# Patient Record
Sex: Female | Born: 1935 | Race: Black or African American | Hispanic: No | Smoking: Never smoker
Health system: Southern US, Community
[De-identification: ages and names within clinical notes are randomized; demographics above are authoritative.]

## PROBLEM LIST (undated history)

## (undated) DIAGNOSIS — F0390 Unspecified dementia without behavioral disturbance: Secondary | ICD-10-CM

## (undated) DIAGNOSIS — E785 Hyperlipidemia, unspecified: Secondary | ICD-10-CM

## (undated) DIAGNOSIS — I10 Essential (primary) hypertension: Secondary | ICD-10-CM

## (undated) DIAGNOSIS — L97529 Non-pressure chronic ulcer of other part of left foot with unspecified severity: Secondary | ICD-10-CM

## (undated) HISTORY — DX: Non-pressure chronic ulcer of other part of left foot with unspecified severity: L97.529

## (undated) HISTORY — DX: Hyperlipidemia, unspecified: E78.5

## (undated) HISTORY — DX: Essential (primary) hypertension: I10

---

## 2000-07-30 ENCOUNTER — Ambulatory Visit (HOSPITAL_COMMUNITY): Admission: AD | Admit: 2000-07-30 | Discharge: 2000-07-30 | Payer: Self-pay | Admitting: Cardiovascular Disease

## 2000-07-30 ENCOUNTER — Encounter: Payer: Self-pay | Admitting: Cardiovascular Disease

## 2006-09-04 ENCOUNTER — Ambulatory Visit (HOSPITAL_COMMUNITY): Admission: RE | Admit: 2006-09-04 | Discharge: 2006-09-04 | Payer: Self-pay | Admitting: Orthopedic Surgery

## 2006-09-08 ENCOUNTER — Inpatient Hospital Stay (HOSPITAL_COMMUNITY): Admission: EM | Admit: 2006-09-08 | Discharge: 2006-09-14 | Payer: Self-pay | Admitting: Emergency Medicine

## 2006-12-11 ENCOUNTER — Encounter: Admission: RE | Admit: 2006-12-11 | Discharge: 2006-12-11 | Payer: Self-pay | Admitting: Internal Medicine

## 2006-12-11 ENCOUNTER — Encounter (INDEPENDENT_AMBULATORY_CARE_PROVIDER_SITE_OTHER): Payer: Self-pay | Admitting: Diagnostic Radiology

## 2007-06-18 ENCOUNTER — Encounter: Admission: RE | Admit: 2007-06-18 | Discharge: 2007-06-18 | Payer: Self-pay | Admitting: Internal Medicine

## 2007-11-27 ENCOUNTER — Encounter: Admission: RE | Admit: 2007-11-27 | Discharge: 2007-11-27 | Payer: Self-pay | Admitting: Internal Medicine

## 2007-12-17 ENCOUNTER — Encounter: Admission: RE | Admit: 2007-12-17 | Discharge: 2007-12-17 | Payer: Self-pay | Admitting: Internal Medicine

## 2008-06-04 ENCOUNTER — Observation Stay (HOSPITAL_COMMUNITY): Admission: EM | Admit: 2008-06-04 | Discharge: 2008-06-05 | Payer: Self-pay | Admitting: Emergency Medicine

## 2008-06-17 ENCOUNTER — Encounter: Admission: RE | Admit: 2008-06-17 | Discharge: 2008-06-17 | Payer: Self-pay | Admitting: Internal Medicine

## 2008-09-19 ENCOUNTER — Inpatient Hospital Stay (HOSPITAL_COMMUNITY): Admission: EM | Admit: 2008-09-19 | Discharge: 2008-09-26 | Payer: Self-pay | Admitting: Emergency Medicine

## 2008-09-20 ENCOUNTER — Encounter (INDEPENDENT_AMBULATORY_CARE_PROVIDER_SITE_OTHER): Payer: Self-pay | Admitting: Internal Medicine

## 2008-09-20 ENCOUNTER — Ambulatory Visit: Payer: Self-pay | Admitting: Vascular Surgery

## 2008-12-19 ENCOUNTER — Encounter: Admission: RE | Admit: 2008-12-19 | Discharge: 2008-12-19 | Payer: Self-pay | Admitting: Internal Medicine

## 2009-06-14 ENCOUNTER — Encounter: Admission: RE | Admit: 2009-06-14 | Discharge: 2009-06-14 | Payer: Self-pay | Admitting: Internal Medicine

## 2009-10-14 ENCOUNTER — Emergency Department (HOSPITAL_COMMUNITY): Admission: EM | Admit: 2009-10-14 | Discharge: 2009-10-14 | Payer: Self-pay | Admitting: Emergency Medicine

## 2009-12-15 ENCOUNTER — Encounter: Admission: RE | Admit: 2009-12-15 | Discharge: 2009-12-15 | Payer: Self-pay | Admitting: Family Medicine

## 2009-12-15 ENCOUNTER — Ambulatory Visit: Payer: Self-pay | Admitting: Internal Medicine

## 2009-12-15 DIAGNOSIS — E785 Hyperlipidemia, unspecified: Secondary | ICD-10-CM | POA: Insufficient documentation

## 2009-12-15 DIAGNOSIS — R05 Cough: Secondary | ICD-10-CM

## 2009-12-15 DIAGNOSIS — I10 Essential (primary) hypertension: Secondary | ICD-10-CM | POA: Insufficient documentation

## 2009-12-15 DIAGNOSIS — Z8672 Personal history of thrombophlebitis: Secondary | ICD-10-CM | POA: Insufficient documentation

## 2009-12-15 DIAGNOSIS — R059 Cough, unspecified: Secondary | ICD-10-CM | POA: Insufficient documentation

## 2010-01-02 ENCOUNTER — Ambulatory Visit: Payer: Self-pay | Admitting: Internal Medicine

## 2010-07-10 NOTE — Assessment & Plan Note (Signed)
Summary: Pulmonary/ f/u ov cough resolved   Visit Type:  Follow-up Copy to:  Dr. Renaye Rakers Primary Provider/Referring Provider:  Dr. Renaye Rakers  CC:  2 week cough follow-up. The patient says she still has an occasional cough but it is much better overall. No complaints today.Marland Kitchen  History of Present Illness: 77 yobf never smoker lives with daughter quite frail but  no chronic respiratory problems.  December 15, 2009  1st pulmonary office eval for cough started spring 2010 gradually worse fluctuates some worse with no pattern x  while on prednisone did not need tessilon which helps some too, mostly dry imp was uacs, rec max gerd rx and change to bystolic in case asthma present  January 02, 2010 2 week cough follow-up. The patient says she still has an occasional cough but it is much better overall. No complaints today. no sob. Pt denies any significant sore throat, dysphagia, itching, sneezing,  nasal congestion or excess secretions,  fever, chills, sweats, unintended wt loss, pleuritic or exertional cp, hempoptysis, change in activity tolerance  orthopnea pnd or leg swelling   Current Medications (verified): 1)  Simvastatin 40 Mg Tabs (Simvastatin) .Marland Kitchen.. 1 At Bedtime 2)  Xalatan 0.005 % Soln (Latanoprost) .Marland Kitchen.. 1 Drop in Each Eye At Bedtime 3)  Omeprazole 20 Mg Cpdr (Omeprazole) .... Take One 30-60 Min Before First and Last Meals of The Day 4)  Diovan 160 Mg Tabs (Valsartan) .Marland Kitchen.. 1 Once Daily 5)  Bystolic 5 Mg  Tabs (Nebivolol Hcl) .... One Tablet Daily  Allergies (verified): 1)  ! Pcn  Past History:  Past Medical History: DEEP VENOUS THROMBOPHLEBITIS, HX OF (ICD-V12.52) HYPERTENSION (ICD-401.9) HYPERLIPIDEMIA (ICD-272.4) Cough..................................................onset spring 2010     - Try off non-specific BB December 15, 2009 > better   Vital Signs:  Patient profile:   78 year old female Height:      63 inches (160.02 cm) Weight:      144.50 pounds (65.68 kg) BMI:      25.69 O2 Sat:      95 % on Room air Temp:     98.6 degrees F (37.00 degrees C) oral Pulse rate:   67 / minute BP sitting:   118 / 74  (left arm) Cuff size:   regular  Vitals Entered By: Michel Bickers CMA (January 02, 2010 11:06 AM)  O2 Sat at Rest %:  95 O2 Flow:  Room air CC: 2 week cough follow-up. The patient says she still has an occasional cough but it is much better overall. No complaints today. Comments Medications reviewed with the patient. Daytime phone number verified. Michel Bickers [facility]  January 02, 2010 11:07 AM   Physical Exam  Additional Exam:  wt 145 December 16, 2009 > 144 January 02, 2010  elderly bf with mint in mouth very hesitant with responses, daughter freq corrects here HEENT: nl dentition, turbinates, and orophanx. Nl external ear canals without cough reflex NECK :  without JVD/Nodes/TM/ nl carotid upstrokes bilaterally LUNGS: no acc muscle use, clear to A and P bilaterally without cough on insp or exp maneuvers CV:  RRR  no s3 or murmur or increase in P2, no edema  ABD:  soft and nontender with nl excursion in the supine position. No bruits or organomegaly, bowel sounds nl MS:  warm without deformities, calf tenderness, cyanosis or clubbing      Impression & Recommendations:  Problem # 1:  COUGH (ICD-786.2)  ? GERD vs pseudoasthma related to beta blockers,  resolved on present rx so continue at least another 4 weeks  Pulmonary f/u can be as needed   Orders: Est. Patient Level III (62130)  Problem # 2:  HYPERTENSION (ICD-401.9)  Her updated medication list for this problem includes:    Diovan 160 Mg Tabs (Valsartan) .Marland Kitchen... 1 once daily    Bystolic 5 Mg Tabs (Nebivolol hcl) ..... One tablet daily  excellent control on present rx which includes Bystolic, the most beta -1  selective Beta blocker available in sample form, with bisoprolol the most selective generic choice  on the market.   Medications Added to Medication List This Visit: 1)  Xalatan 0.005 % Soln  (Latanoprost) .Marland Kitchen.. 1 drop in each eye at bedtime 2)  Omeprazole 20 Mg Cpdr (Omeprazole) .... Take  one 30-60 min before first meal of the day  Patient Instructions: 1)  reduce omeprazole to Take  one 30-60 min before first meal of the day  2)   Please schedule a follow-up appointment as needed and let Dr Parke Simmers re check you before you run out of bystolic Prescriptions: BYSTOLIC 5 MG  TABS (NEBIVOLOL HCL) One tablet daily  #30 x 11   Entered and Authorized by:   Nyoka Cowden MD   Signed by:   Nyoka Cowden MD on 01/02/2010   Method used:   Electronically to        [facility] (retail)       [address]       [place], [place]  [address]       Ph: [phone]       Fax: [phone]   RxID:   [id number]

## 2010-07-10 NOTE — Assessment & Plan Note (Signed)
Summary: Pulmonary/ new pt eval for cough on 2 beta blockers   Visit Type:  Initial Consult Copy to:  Dr. Renaye Rakers Primary Provider/Referring Provider:  Dr. Renaye Rakers  CC:  Cough.  History of Present Illness: 65 yobf never smoker lives with daughter quite frail but  no chronic respiratory problems.  December 15, 2009  1st pulmonary office eval for cough started spring 2010 gradually worse fluctuates some worse with no pattern x  while on prednisone did not need tessilon which helps some too, mostly dry. Pt denies any significant sore throat, dysphagia, itching, sneezing,  nasal congestion or excess secretions,  fever, chills, sweats, unintended wt loss, pleuritic or exertional cp, hempoptysis, change in activity tolerance  orthopnea pnd or leg swelling.  Pt also denies any obvious fluctuation in symptoms with weather or environmental change or other alleviating or aggravating factors.       Current Medications (verified): 1)  Simvastatin 40 Mg Tabs (Simvastatin) .Marland Kitchen.. 1 At Bedtime 2)  Timoptic 0.5 % Soln (Timolol Maleate) .Marland Kitchen.. 1 Drop Each Eye Every Am 3)  Omeprazole 20 Mg Cpdr (Omeprazole) .Marland Kitchen.. 1 Once Daily 4)  Diovan 160 Mg Tabs (Valsartan) .Marland Kitchen.. 1 Once Daily 5)  Carvedilol 6.25 Mg Tabs (Carvedilol) .Marland Kitchen.. 1 Two Times A Day  Allergies (verified): 1)  ! Pcn  Past History:  Past Medical History: DEEP VENOUS THROMBOPHLEBITIS, HX OF (ICD-V12.52) HYPERTENSION (ICD-401.9) HYPERLIPIDEMIA (ICD-272.4) Cough..................................................onset     - Try off non-specific BB December 15, 2009   Family History: Breast CA- Mother Negative for respiratory diseases or atopy   Social History: Widowed Children Never smoker No ETOH  Review of Systems       The patient complains of shortness of breath with activity, shortness of breath at rest, productive cough, non-productive cough, hand/feet swelling, and joint stiffness or pain.  The patient denies coughing up blood, chest  pain, irregular heartbeats, acid heartburn, indigestion, loss of appetite, weight change, abdominal pain, difficulty swallowing, sore throat, tooth/dental problems, headaches, nasal congestion/difficulty breathing through nose, sneezing, itching, ear ache, anxiety, depression, rash, change in color of mucus, and fever.    Vital Signs:  Patient profile:   77 year old female Height:      63 inches Weight:      145 pounds BMI:     25.78 O2 Sat:      98 % on Room air Temp:     98.9 degrees F oral Pulse rate:   64 / minute BP sitting:   150 / 82  (left arm)  Vitals Entered By: Vernie Murders (December 15, 2009 10:50 AM)  O2 Flow:  Room air  Physical Exam  Additional Exam:  wt 145 December 16, 2009 elderly bf with mint in mouth very hesitant with responses, daughter freq corrects here HEENT: nl dentition, turbinates, and orophanx. Nl external ear canals without cough reflex NECK :  without JVD/Nodes/TM/ nl carotid upstrokes bilaterally LUNGS: no acc muscle use, clear to A and P bilaterally without cough on insp or exp maneuvers CV:  RRR  no s3 or murmur or increase in P2, no edema  ABD:  soft and nontender with nl excursion in the supine position. No bruits or organomegaly, bowel sounds nl MS:  warm without deformities, calf tenderness, cyanosis or clubbing SKIN: warm and dry without lesions   NEURO:recall poor, no obvious focal deficits     CXR  Procedure date:  12/15/2009  Findings:      IMPRESSION: Interval  resolution of the lingular/left upper lobe pneumonia since the examination 2 months ago.  Stable cardiomegaly.  No acute cardiopulmonary disease currently.  Impression & Recommendations:  Problem # 1:  COUGH (ICD-786.2) Assessment Improved The most common causes of chronic cough in immunocompetent adults include: upper airway cough syndrome (UACS), previously referred to as postnasal drip syndrome,  caused by variety of rhinosinus conditions; (2) asthma; (3) GERD; (4) chronic  bronchitis from cigarette smoking or other inhaled environmental irritants; (5) nonasthmatic eosinophilic bronchitis; and (6) bronchiectasis. These conditions, singly or in combination, have accounted for up to 94% of the causes of chronic cough in prospective studies.   Previous response to prednisone suggests allergic rhinitis, eos bronchitis or cough variant asthma.    Try off nonselective beta blockers first step plus dietary modification in case reflux component.  The standardized cough guidelines  published in Chest are a 14 step process, not a single office visit,  and are intended  to address this problem logically,  with an alogrithm dependent on response to each progressive step  to determine a specific diagnosis with  minimal addtional testing needed. Therefore if compliance is an issue this empiric standardized approach simply won't work.   Problem # 2:  HYPERTENSION (ICD-401.9)  The following medications were removed from the medication list:    Carvedilol 6.25 Mg Tabs (Carvedilol) .Marland Kitchen... 1 two times a day Her updated medication list for this problem includes:    Diovan 160 Mg Tabs (Valsartan) .Marland Kitchen... 1 once daily    Bystolic 5 Mg Tabs (Nebivolol hcl) ..... One tablet daily -( Bystolic, the most beta -1  selective Beta blocker available in sample form, with bisoprolol the most selective generic choice  on the market)   Medications Added to Medication List This Visit: 1)  Simvastatin 40 Mg Tabs (Simvastatin) .Marland Kitchen.. 1 at bedtime 2)  Timoptic 0.5 % Soln (Timolol maleate) .Marland Kitchen.. 1 drop each eye every am 3)  Omeprazole 20 Mg Cpdr (Omeprazole) .Marland Kitchen.. 1 once daily 4)  Omeprazole 20 Mg Cpdr (Omeprazole) .... Take one 30-60 min before first and last meals of the day 5)  Diovan 160 Mg Tabs (Valsartan) .Marland Kitchen.. 1 once daily 6)  Carvedilol 6.25 Mg Tabs (Carvedilol) .Marland Kitchen.. 1 two times a day 7)  Bystolic 5 Mg Tabs (Nebivolol hcl) .... One tablet daily 8)  Prednisone 10 Mg Tabs (Prednisone) .... 4 each am x  2days, 2x2days, 1x2days and stop  Other Orders: T-2 View CXR ([id number])  Patient Instructions: 1)  Acid reflux is the leading suspect here and needs to be eliminated  completely before considering additional studies or treatment options. To suppress this maximally, take omeprazole (prilosec 20)  otc before first  and last meal and pepcid 20 mg (otc) at bedtime plus diet measures as listed.  2)  Ask eye doctor for betoptic instead for timoptic as this may be asthma 3)  Stop coreg and take bystolic 5 mg one daily 4)  Prednisone 4 each am x 2days, 2x2days, 1x2days and stop  5)  GERD (REFLUX)  is a common cause of respiratory symptoms. It commonly presents without heartburn and can be treated with medication, but also with lifestyle changes including avoidance of late meals, excessive alcohol, smoking cessation, and avoid fatty foods, chocolate, peppermint, colas, red wine, and acidic juices such as orange juice. NO MINT OR MENTHOL PRODUCTS SO NO COUGH DROPS  6)  USE SUGARLESS CANDY INSTEAD (jolley ranchers)  7)  NO OIL BASED VITAMINS  8)  Please schedule  a follow-up appointment in 2  weeks, sooner if needed  Prescriptions: PREDNISONE 10 MG  TABS (PREDNISONE) 4 each am x 2days, 2x2days, 1x2days and stop  #14 x 0   Entered and Authorized by:   Nyoka Cowden MD   Signed by:   Nyoka Cowden MD on 12/15/2009   Method used:   Electronically to        [facility] (retail)       [address]       [place], [place]  [address]       Ph: [phone]       Fax: [phone]   RxID:   [id number]   Appended Document: Orders Update    Clinical Lists Changes  Orders: Added new Service order of New Patient Level V [phone]) - Signed

## 2010-08-28 LAB — DIFFERENTIAL
Basophils Absolute: 0 10*3/uL (ref 0.0–0.1)
Basophils Relative: 0 % (ref 0–1)
Eosinophils Absolute: 0.1 10*3/uL (ref 0.0–0.7)
Eosinophils Relative: 1 % (ref 0–5)
Lymphocytes Relative: 11 % — ABNORMAL LOW (ref 12–46)
Lymphs Abs: 1.2 10*3/uL (ref 0.7–4.0)
Monocytes Absolute: 1.1 10*3/uL — ABNORMAL HIGH (ref 0.1–1.0)
Monocytes Relative: 10 % (ref 3–12)
Neutro Abs: 8.3 10*3/uL — ABNORMAL HIGH (ref 1.7–7.7)
Neutrophils Relative %: 78 % — ABNORMAL HIGH (ref 43–77)

## 2010-08-28 LAB — POCT I-STAT, CHEM 8
BUN: 21 mg/dL (ref 6–23)
Calcium, Ion: 1.09 mmol/L — ABNORMAL LOW (ref 1.12–1.32)
Chloride: 105 mEq/L (ref 96–112)
Creatinine, Ser: 1.4 mg/dL — ABNORMAL HIGH (ref 0.4–1.2)
Glucose, Bld: 125 mg/dL — ABNORMAL HIGH (ref 70–99)
HCT: 35 % — ABNORMAL LOW (ref 36.0–46.0)
Hemoglobin: 11.9 g/dL — ABNORMAL LOW (ref 12.0–15.0)
Potassium: 3.7 mEq/L (ref 3.5–5.1)
Sodium: 139 mEq/L (ref 135–145)
TCO2: 25 mmol/L (ref 0–100)

## 2010-08-28 LAB — POCT CARDIAC MARKERS
CKMB, poc: 4.6 ng/mL (ref 1.0–8.0)
Myoglobin, poc: 391 ng/mL (ref 12–200)
Troponin i, poc: <0.05 ng/mL (ref 0.00–0.09)

## 2010-08-28 LAB — CBC
HCT: 32 % — ABNORMAL LOW (ref 36.0–46.0)
Hemoglobin: 10.9 g/dL — ABNORMAL LOW (ref 12.0–15.0)
MCHC: 34.1 g/dL (ref 30.0–36.0)
MCV: 94.6 fL (ref 78.0–100.0)
Platelets: 217 10*3/uL (ref 150–400)
RBC: 3.38 MIL/uL — ABNORMAL LOW (ref 3.87–5.11)
RDW: 13.9 % (ref 11.5–15.5)
WBC: 10.7 10*3/uL — ABNORMAL HIGH (ref 4.0–10.5)

## 2010-08-28 LAB — BRAIN NATRIURETIC PEPTIDE: Pro B Natriuretic peptide (BNP): 130 pg/mL — ABNORMAL HIGH (ref 0.0–100.0)

## 2010-09-19 LAB — CBC
HCT: 28.3 % — ABNORMAL LOW (ref 36.0–46.0)
HCT: 29.9 % — ABNORMAL LOW (ref 36.0–46.0)
HCT: 30.4 % — ABNORMAL LOW (ref 36.0–46.0)
HCT: 31.4 % — ABNORMAL LOW (ref 36.0–46.0)
HCT: 14.1 % — ABNORMAL LOW (ref 36.0–46.0)
HCT: 21.7 % — ABNORMAL LOW (ref 36.0–46.0)
HCT: 31.4 % — ABNORMAL LOW (ref 36.0–46.0)
HCT: 32 % — ABNORMAL LOW (ref 36.0–46.0)
Hemoglobin: 10.1 g/dL — ABNORMAL LOW (ref 12.0–15.0)
Hemoglobin: 10.3 g/dL — ABNORMAL LOW (ref 12.0–15.0)
Hemoglobin: 10.6 g/dL — ABNORMAL LOW (ref 12.0–15.0)
Hemoglobin: 9.6 g/dL — ABNORMAL LOW (ref 12.0–15.0)
Hemoglobin: 10.7 g/dL — ABNORMAL LOW (ref 12.0–15.0)
Hemoglobin: 11 g/dL — ABNORMAL LOW (ref 12.0–15.0)
Hemoglobin: 4.6 g/dL — CL (ref 12.0–15.0)
Hemoglobin: 7.5 g/dL — CL (ref 12.0–15.0)
MCHC: 33.6 g/dL (ref 30.0–36.0)
MCHC: 33.8 g/dL (ref 30.0–36.0)
MCHC: 33.9 g/dL (ref 30.0–36.0)
MCHC: 34.1 g/dL (ref 30.0–36.0)
MCHC: 32.7 g/dL (ref 30.0–36.0)
MCHC: 33.9 g/dL (ref 30.0–36.0)
MCHC: 34.5 g/dL (ref 30.0–36.0)
MCHC: 34.6 g/dL (ref 30.0–36.0)
MCV: 89.9 fL (ref 78.0–100.0)
MCV: 90.3 fL (ref 78.0–100.0)
MCV: 90.3 fL (ref 78.0–100.0)
MCV: 90.5 fL (ref 78.0–100.0)
MCV: 89.4 fL (ref 78.0–100.0)
MCV: 90 fL (ref 78.0–100.0)
MCV: 91.8 fL (ref 78.0–100.0)
MCV: 97.6 fL (ref 78.0–100.0)
Platelets: 122 10*3/uL — ABNORMAL LOW (ref 150–400)
Platelets: 124 10*3/uL — ABNORMAL LOW (ref 150–400)
Platelets: 131 10*3/uL — ABNORMAL LOW (ref 150–400)
Platelets: 134 10*3/uL — ABNORMAL LOW (ref 150–400)
Platelets: 130 10*3/uL — ABNORMAL LOW (ref 150–400)
Platelets: 131 10*3/uL — ABNORMAL LOW (ref 150–400)
Platelets: 142 10*3/uL — ABNORMAL LOW (ref 150–400)
Platelets: 196 10*3/uL (ref 150–400)
RBC: 3.15 MIL/uL — ABNORMAL LOW (ref 3.87–5.11)
RBC: 3.3 MIL/uL — ABNORMAL LOW (ref 3.87–5.11)
RBC: 3.37 MIL/uL — ABNORMAL LOW (ref 3.87–5.11)
RBC: 3.48 MIL/uL — ABNORMAL LOW (ref 3.87–5.11)
RBC: 1.45 MIL/uL — ABNORMAL LOW (ref 3.87–5.11)
RBC: 2.37 MIL/uL — ABNORMAL LOW (ref 3.87–5.11)
RBC: 3.49 MIL/uL — ABNORMAL LOW (ref 3.87–5.11)
RBC: 3.58 MIL/uL — ABNORMAL LOW (ref 3.87–5.11)
RDW: 15.5 % (ref 11.5–15.5)
RDW: 15.6 % — ABNORMAL HIGH (ref 11.5–15.5)
RDW: 15.9 % — ABNORMAL HIGH (ref 11.5–15.5)
RDW: 16.4 % — ABNORMAL HIGH (ref 11.5–15.5)
RDW: 15.7 % — ABNORMAL HIGH (ref 11.5–15.5)
RDW: 16.5 % — ABNORMAL HIGH (ref 11.5–15.5)
RDW: 16.8 % — ABNORMAL HIGH (ref 11.5–15.5)
RDW: 17.3 % — ABNORMAL HIGH (ref 11.5–15.5)
WBC: 4.7 10*3/uL (ref 4.0–10.5)
WBC: 5 10*3/uL (ref 4.0–10.5)
WBC: 5.6 10*3/uL (ref 4.0–10.5)
WBC: 6.5 10*3/uL (ref 4.0–10.5)
WBC: 5.7 10*3/uL (ref 4.0–10.5)
WBC: 6 10*3/uL (ref 4.0–10.5)
WBC: 6.1 10*3/uL (ref 4.0–10.5)
WBC: 6.3 10*3/uL (ref 4.0–10.5)

## 2010-09-19 LAB — DIFFERENTIAL
Basophils Absolute: 0 10*3/uL (ref 0.0–0.1)
Basophils Relative: 0 % (ref 0–1)
Eosinophils Absolute: 0.1 10*3/uL (ref 0.0–0.7)
Eosinophils Relative: 2 % (ref 0–5)
Lymphocytes Relative: 21 % (ref 12–46)
Lymphs Abs: 1.3 10*3/uL (ref 0.7–4.0)
Monocytes Absolute: 0.7 10*3/uL (ref 0.1–1.0)
Monocytes Relative: 12 % (ref 3–12)
Neutro Abs: 4 10*3/uL (ref 1.7–7.7)
Neutrophils Relative %: 65 % (ref 43–77)

## 2010-09-19 LAB — POCT CARDIAC MARKERS
CKMB, poc: 1 ng/mL (ref 1.0–8.0)
Myoglobin, poc: 212 ng/mL (ref 12–200)
Troponin i, poc: <0.05 ng/mL (ref 0.00–0.09)

## 2010-09-19 LAB — BASIC METABOLIC PANEL
BUN: 5 mg/dL — ABNORMAL LOW (ref 6–23)
BUN: 6 mg/dL (ref 6–23)
BUN: 8 mg/dL (ref 6–23)
BUN: 11 mg/dL (ref 6–23)
BUN: 16 mg/dL (ref 6–23)
BUN: 35 mg/dL — ABNORMAL HIGH (ref 6–23)
CO2: 24 mEq/L (ref 19–32)
CO2: 24 mEq/L (ref 19–32)
CO2: 25 mEq/L (ref 19–32)
CO2: 22 mEq/L (ref 19–32)
CO2: 24 mEq/L (ref 19–32)
CO2: 25 mEq/L (ref 19–32)
Calcium: 8.1 mg/dL — ABNORMAL LOW (ref 8.4–10.5)
Calcium: 8.2 mg/dL — ABNORMAL LOW (ref 8.4–10.5)
Calcium: 8.5 mg/dL (ref 8.4–10.5)
Calcium: 8 mg/dL — ABNORMAL LOW (ref 8.4–10.5)
Calcium: 8.1 mg/dL — ABNORMAL LOW (ref 8.4–10.5)
Calcium: 8.2 mg/dL — ABNORMAL LOW (ref 8.4–10.5)
Chloride: 109 mEq/L (ref 96–112)
Chloride: 113 mEq/L — ABNORMAL HIGH (ref 96–112)
Chloride: 114 mEq/L — ABNORMAL HIGH (ref 96–112)
Chloride: 111 mEq/L (ref 96–112)
Chloride: 111 mEq/L (ref 96–112)
Chloride: 113 mEq/L — ABNORMAL HIGH (ref 96–112)
Creatinine, Ser: 1.01 mg/dL (ref 0.4–1.2)
Creatinine, Ser: 1.02 mg/dL (ref 0.4–1.2)
Creatinine, Ser: 1.14 mg/dL (ref 0.4–1.2)
Creatinine, Ser: 1.12 mg/dL (ref 0.4–1.2)
Creatinine, Ser: 1.25 mg/dL — ABNORMAL HIGH (ref 0.4–1.2)
Creatinine, Ser: 1.41 mg/dL — ABNORMAL HIGH (ref 0.4–1.2)
GFR calc Af Amer: 54 mL/min — ABNORMAL LOW (ref >60)
GFR calc Af Amer: >60 mL/min (ref >60)
GFR calc Af Amer: >60 mL/min (ref >60)
GFR calc Af Amer: 42 mL/min — ABNORMAL LOW (ref >60)
GFR calc Af Amer: 49 mL/min — ABNORMAL LOW (ref >60)
GFR calc Af Amer: 55 mL/min — ABNORMAL LOW (ref >60)
GFR calc non Af Amer: 45 mL/min — ABNORMAL LOW (ref >60)
GFR calc non Af Amer: 51 mL/min — ABNORMAL LOW (ref >60)
GFR calc non Af Amer: 51 mL/min — ABNORMAL LOW (ref >60)
GFR calc non Af Amer: 35 mL/min — ABNORMAL LOW (ref >60)
GFR calc non Af Amer: 40 mL/min — ABNORMAL LOW (ref >60)
GFR calc non Af Amer: 46 mL/min — ABNORMAL LOW (ref >60)
Glucose, Bld: 92 mg/dL (ref 70–99)
Glucose, Bld: 93 mg/dL (ref 70–99)
Glucose, Bld: 98 mg/dL (ref 70–99)
Glucose, Bld: 103 mg/dL — ABNORMAL HIGH (ref 70–99)
Glucose, Bld: 104 mg/dL — ABNORMAL HIGH (ref 70–99)
Glucose, Bld: 98 mg/dL (ref 70–99)
Potassium: 3.2 mEq/L — ABNORMAL LOW (ref 3.5–5.1)
Potassium: 3.4 mEq/L — ABNORMAL LOW (ref 3.5–5.1)
Potassium: 3.8 mEq/L (ref 3.5–5.1)
Potassium: 3.3 mEq/L — ABNORMAL LOW (ref 3.5–5.1)
Potassium: 3.4 mEq/L — ABNORMAL LOW (ref 3.5–5.1)
Potassium: 3.7 mEq/L (ref 3.5–5.1)
Sodium: 140 mEq/L (ref 135–145)
Sodium: 142 mEq/L (ref 135–145)
Sodium: 142 mEq/L (ref 135–145)
Sodium: 143 mEq/L (ref 135–145)
Sodium: 143 mEq/L (ref 135–145)
Sodium: 143 mEq/L (ref 135–145)

## 2010-09-19 LAB — CROSSMATCH
ABO/RH(D): A POS
Antibody Screen: NEGATIVE

## 2010-09-19 LAB — PROTIME-INR
INR: 1.3 (ref 0.00–1.49)
INR: 1.2 (ref 0.00–1.49)
INR: 1.3 (ref 0.00–1.49)
INR: 1.3 (ref 0.00–1.49)
INR: 3.6 — ABNORMAL HIGH (ref 0.00–1.49)
Prothrombin Time: 16.3 seconds — ABNORMAL HIGH (ref 11.6–15.2)
Prothrombin Time: 15.8 seconds — ABNORMAL HIGH (ref 11.6–15.2)
Prothrombin Time: 16.2 seconds — ABNORMAL HIGH (ref 11.6–15.2)
Prothrombin Time: 16.7 seconds — ABNORMAL HIGH (ref 11.6–15.2)
Prothrombin Time: 39.2 seconds — ABNORMAL HIGH (ref 11.6–15.2)

## 2010-09-19 LAB — RETICULOCYTES
RBC.: 2.43 MIL/uL — ABNORMAL LOW (ref 3.87–5.11)
Retic Count, Absolute: 109.4 10*3/uL (ref 19.0–186.0)
Retic Ct Pct: 4.5 % — ABNORMAL HIGH (ref 0.4–3.1)

## 2010-09-19 LAB — VITAMIN B12: Vitamin B-12: 449 pg/mL (ref 211–911)

## 2010-09-19 LAB — URINE MICROSCOPIC-ADD ON

## 2010-09-19 LAB — POCT I-STAT, CHEM 8
BUN: 72 mg/dL — ABNORMAL HIGH (ref 6–23)
Calcium, Ion: 1.17 mmol/L (ref 1.12–1.32)
Chloride: 117 mEq/L — ABNORMAL HIGH (ref 96–112)
Creatinine, Ser: 1.6 mg/dL — ABNORMAL HIGH (ref 0.4–1.2)
Glucose, Bld: 116 mg/dL — ABNORMAL HIGH (ref 70–99)
HCT: 15 % — ABNORMAL LOW (ref 36.0–46.0)
Hemoglobin: 5.1 g/dL — CL (ref 12.0–15.0)
Potassium: 3.7 mEq/L (ref 3.5–5.1)
Sodium: 147 mEq/L — ABNORMAL HIGH (ref 135–145)
TCO2: 20 mmol/L (ref 0–100)

## 2010-09-19 LAB — URINALYSIS, ROUTINE W REFLEX MICROSCOPIC
Bilirubin Urine: NEGATIVE
Glucose, UA: NEGATIVE mg/dL
Ketones, ur: NEGATIVE mg/dL
Leukocytes, UA: NEGATIVE
Nitrite: NEGATIVE
Protein, ur: NEGATIVE mg/dL
Specific Gravity, Urine: 1.017 (ref 1.005–1.030)
Urobilinogen, UA: 0.2 mg/dL (ref 0.0–1.0)
pH: 5.5 (ref 5.0–8.0)

## 2010-09-19 LAB — APTT
aPTT: 31 seconds (ref 24–37)
aPTT: 29 seconds (ref 24–37)

## 2010-09-19 LAB — URINE CULTURE
Colony Count: NO GROWTH
Culture: NO GROWTH

## 2010-09-19 LAB — CARDIAC PANEL(CRET KIN+CKTOT+MB+TROPI)
CK, MB: 2.4 ng/mL (ref 0.3–4.0)
Relative Index: 1.4 (ref 0.0–2.5)
Total CK: 172 U/L (ref 7–177)
Troponin I: 0.04 ng/mL (ref 0.00–0.06)

## 2010-09-19 LAB — PREPARE RBC (CROSSMATCH)

## 2010-09-19 LAB — HEPATIC FUNCTION PANEL
ALT: 21 U/L (ref 0–35)
AST: 43 U/L — ABNORMAL HIGH (ref 0–37)
Albumin: 2.9 g/dL — ABNORMAL LOW (ref 3.5–5.2)
Alkaline Phosphatase: 52 U/L (ref 39–117)
Bilirubin, Direct: 0.3 mg/dL (ref 0.0–0.3)
Indirect Bilirubin: 1.5 mg/dL — ABNORMAL HIGH (ref 0.3–0.9)
Total Bilirubin: 1.8 mg/dL — ABNORMAL HIGH (ref 0.3–1.2)
Total Protein: 6.4 g/dL (ref 6.0–8.3)

## 2010-09-19 LAB — IRON AND TIBC
Iron: 18 ug/dL — ABNORMAL LOW (ref 42–135)
Saturation Ratios: 6 % — ABNORMAL LOW (ref 20–55)
TIBC: 298 ug/dL (ref 250–470)
UIBC: 280 ug/dL

## 2010-09-19 LAB — PREPARE FRESH FROZEN PLASMA

## 2010-09-19 LAB — FOLATE: Folate: >20 ng/mL

## 2010-09-19 LAB — GLUCOSE, CAPILLARY: Glucose-Capillary: 108 mg/dL — ABNORMAL HIGH (ref 70–99)

## 2010-09-19 LAB — HEMOCCULT GUIAC POC 1CARD (OFFICE): Fecal Occult Bld: POSITIVE

## 2010-09-19 LAB — TSH: TSH: 2.146 u[IU]/mL (ref 0.350–4.500)

## 2010-09-19 LAB — ABO/RH: ABO/RH(D): A POS

## 2010-09-19 LAB — FERRITIN: Ferritin: 18 ng/mL (ref 10–291)

## 2010-09-24 ENCOUNTER — Ambulatory Visit
Admission: RE | Admit: 2010-09-24 | Discharge: 2010-09-24 | Disposition: A | Discharge status: Home / Self Care | Payer: Medicare HMO | Source: Ambulatory Visit | Attending: Orthopedic Surgery | Admitting: Orthopedic Surgery

## 2010-09-24 ENCOUNTER — Other Ambulatory Visit: Payer: Self-pay | Admitting: Orthopedic Surgery

## 2010-09-24 DIAGNOSIS — M5137 Other intervertebral disc degeneration, lumbosacral region: Secondary | ICD-10-CM

## 2010-09-24 DIAGNOSIS — R52 Pain, unspecified: Secondary | ICD-10-CM

## 2010-10-22 ENCOUNTER — Other Ambulatory Visit: Payer: Self-pay | Admitting: Family Medicine

## 2010-10-22 DIAGNOSIS — M7989 Other specified soft tissue disorders: Secondary | ICD-10-CM

## 2010-10-23 NOTE — H&P (Signed)
[name], [name] NO.:  [id number]   MEDICAL RECORD NO.:  [id number]          PATIENT TYPE:  EMS   LOCATION:  [facility]                         FACILITY:  [facility]   PHYSICIAN:  [name], M.D.      DATE OF BIRTH:  [birth year]   DATE OF ADMISSION:  06/04/2008  DATE OF DISCHARGE:                              HISTORY & PHYSICAL   PRIMARY CARE PHYSICIAN:  [name].   PRESENTING COMPLAINT:  Shortness of breath.   HISTORY OF PRESENT ILLNESS:  The patient is a 77 year old female with  history of pulmonary embolism back in April 2008, who is still on  Coumadin that started having problem with chest pain and shortness of  breath today.  The shortness of breath is mainly at rest and also with  exertion.  She denied any cough or hemoptysis.  She has some chest  discomfort but not clear-cut pain.  Denied any hemoptysis.  Denied any  PND or orthopnea.  The onset was today and persistent.  The patient  therefore came to the emergency room for further management.  She is  feeling much better now in the ER.  Denied any diaphoresis.  No  radiation of chest pain.  No aggravating or relieving factors.   PAST MEDICAL HISTORY:  1. Hypertension.  2. Osteoarthritis.  3. History of coronary artery disease with Dr. Nanetta Batty her      cardiologist.  4. The patient also had a prior history of PE in March 2008.  5. Glaucoma.  6. Some TIAs.  7. Chronic dizziness on meclizine.  8. Dyslipidemia.   ALLERGIES:  SHE IS ALLERGIC TO PENICILLIN.   MEDICATIONS:  1. Cosopt eye drops one each eye daily twice a day.  2. Alphagan 1 in each eye three times a day.  3. Omeprazole 20 mg twice a day.  4. Darvocet-N 100 q.6 h., p.r.n.  5. Meclizine 25 mg q.6 h., p.r.n.  6. Nitroglycerin 0.4 mg q.5 minutes p.r.n.  7. Simvastatin 40 mg daily.  8. Diovan/hydrochlorothiazide HCT 160/12.5 daily.  9. Simvastatin 20 mg daily.  10.Aspirin 81 mg daily.   SOCIAL HISTORY:  The patient lives  in [place] with family.  No  tobacco, alcohol or IV drug use.   FAMILY HISTORY:  Noncontributory due to the patient's age.   REVIEW OF SYSTEMS:  A 14 point review of systems is negative except per  HPI.   PHYSICAL EXAMINATION:  VITAL SIGNS:  Temperature 98.  Initial Bruce  pressure 185/68, currently 128/98 with a pulse of 64, respiratory rate  16, sats 95% on room air.  GENERAL:  The patient is awake, alert and oriented.  A very pleasant  woman in no acute distress.  HEENT:  PERRL.  EOMI.  NECK:  Supple.  No JVD, no lymphadenopathy.  RESPIRATORY:  She has good air entry bilaterally.  No wheezes or rales.  CARDIOVASCULAR:  She has S1-S2.  No murmur.  ABDOMEN:  Soft, nontender with positive bowel.  EXTREMITIES:  No edema, cyanosis or clubbing.   LABORATORY DATA:  White count is 4.5, hemoglobin 10.6  with an MCV of  95.8.  Platelet count 164, PT 26.5, INR 2.3.  PTT 45.  BNP is 30.  CK is  117 with an MB of 1.5.  Magnesium 2.2.  Troponin is 0.01.  Urinalysis is  negative.  Sodium 138, potassium 4.0, chloride 108, CO2 of 26, glucose  107, BUN 25, creatinine 1.28, calcium 8.6.  Her chest x-ray shows stable  cardiomegaly with suboptimal inspiration.  Possibly bibasilar  atelectasis, but no acute cardiopulmonary changes.  EKG showed no  significant change from previous EKG.   ASSESSMENT:  This a 77 year old female with history of coronary artery  disease, as well as pulmonary edema who is therapeutic on Coumadin  presenting with shortness of breath.  Differentials are quite varied.  This ranged from his acute coronary syndrome or angina equivalent and  also pulmonary embolism even though her INR is very much therapeutic.   PLAN:  1. Chest pain.  Will admit the patient for cardiac rule out with      serial cardiac enzymes.  Keep her on oxygen.  Continue with her      home therapy.  Will also get a chest CT with contrast with PE      protocol to rule out PE.  Will get a 2-D echo as  well.  Even though      her BNP is 30, showing that this is probably less likely to be      cardiac.  Hopefully, the patient's results will be back early      enough so will start in-house observation.  2. Coronary artery disease.  Again will treat her as indicated above      with ruling out MI.  3. Dyslipidemia.  I will check fasting lipid panel and continue with      her simvastatin.  4. Hypertension.  I will continue with her Diovan/hydrochlorothiazide      at this point.  5. Anemia.  The patient has what appears to be chronic anemia,      normocytic anemia.  Will check an anemia panel, including B12 and      folate.  Further treatment will depend on how patient responds to      these initial measures.      [name], M.D.  Electronically Signed     LG/MEDQ  D:  06/04/2008  T:  06/04/2008  Job:  [id number]

## 2010-10-23 NOTE — H&P (Signed)
[name], STAVE NO.:  [id number]   MEDICAL RECORD NO.:  [id number]          PATIENT TYPE:  INP   LOCATION:  1823                         FACILITY:  [facility]   PHYSICIAN:  Eduard Clos, MDDATE OF BIRTH:  [birth year]   DATE OF ADMISSION:  09/19/2008  DATE OF DISCHARGE:                              HISTORY & PHYSICAL   The patient is a primary care patient of Dr. Dorothyann Peng.   CHIEF COMPLAINT/BRIEF ADMITTING HISTORY OF PRESENTING ILLNESS:  A 77-  year-old female with a history of pulmonary embolism diagnosed in April  2008  on Coumadin, history of CAD, hypertension, hyperlipidemia,  glaucoma, history of TIA was brought into the ER.  The patient's family  found that the patient was having increased weakness over the last 1  week.  Patient also usually walks from the bed to the bathroom with help  of a walker.  Was unable to do her usual activities.  She also had some  difficulty talking, but she was feeling very weak to talk, but had no  difficulty swallowing.  Did not have any focal weakness or had any  headache or loss of consciousness.  Denied any chest pain, shortness of  breath.  Denies any fever, chills, nausea, vomiting, diarrhea, dysuria,  discharge.  The patient was found to have very low hemoglobin at 5.1 and  did not notice any black stools or nausea or vomiting or anything bloody  in the stool.   In the ER the patient was found to be having hemoglobin of 5.1 at the  time of admission.  For further management of her serial anemia on  admission, patient also has coagulopathy from her Coumadin.  INR of 3.6  blood transfusion now.  vitamin K to reverse her coagulopathy I did  discuss with patient's daughter  reversing the coagulation, but at this  time  also mildly hypertensive .   PAST MEDICAL HISTORY:  1. Hypertension.  2. Hyperlipidemia.  3. Arthritis.  4. Glaucoma.   PAST SURGICAL HISTORY:  None per family.   MEDICATIONS AT  ADMISSION:  1. Cosopt eye drops.  2. Alphagan 1 drop.  3. meprazole 20 mg p.o. daily.  4. Darvocet-N 100 p.o.  for Pain.  5. Simvastatin 30 mg p.o. daily.  6. Diovan HCT 160/12.5 mg p.o. daily.  7. Imdur 60 mg p.o. daily.   ALLERGIES:  PENICILLIN  .   Denies smoking cigarettes .   PHYSICAL EXAMINATION:  The patient examined at bedside.  Not in acute  distress.  VITAL SIGNS:  Blood pressure 104/90, pulse 67,  temperature 98.1,  respirations 18.  HEENT:  Anicteric with pallor, tongue is midline, no facial asymmetry .  HEART:  S1, S2 heard.  ABDOMEN:  Soft, nontender, bowel sounds heard.  CNS:  Alert, awake, and oriented to time, place, and person.  Moves  upper and lower extremities.  EXTREMITIES:  Peripheral pulses felt, no edema.   LABORATORIES:  CBC:  WBC 6.1, hemoglobin 5.1, hematocrit 13, platelets  196,  .  PT/INR 39.2 and 3.6.  Metabolic panel:  Sodium 145, potassium  3.5, chloride 117, glucose 115, BUN 72, creatinine 0.6.  Troponin  negative.  UA:  Trace blood, wbc's 0,  .   Assessment   Generalized weakness and fatigue from dehydration/Rule out CVA  History of hypertension.  Coronary artery disease  Hyperlipidemia.  Hypernatremia from dehydration.  History of transient ischemic attack.   PLAN:  Will admit patient to step-down unit for close observation.  At  this time a total of 3 units of  PRBCs have been ordered.  Will  discontinue Coumadin.  Will recheck Dopplers of lower extremities to  make sure there are not any active DVT's.  Get the CT of the head as  patient had some difficulty expressing  , but presently  has no focal  deficit.  hOLD all of antihypertensives .  Will give fluid resuscitation  .  GI consult  will follow their recommendations and further  recommendations as condition evolves .      Eduard Clos, MD  Electronically Signed     ANK/MEDQ  D:  09/19/2008  T:  09/19/2008  Job:  [id number]   cc:   Candyce Churn. Allyne Gee, M.D.

## 2010-10-23 NOTE — Discharge Summary (Signed)
NAMEBRANTLEIGH, MIFFLIN               ACCOUNT NO.:  [id number]   MEDICAL RECORD NO.:  [id number]          PATIENT TYPE:  INP   LOCATION:  5532                         FACILITY:  [facility]   PHYSICIAN:  Beckey Rutter, MD  DATE OF BIRTH:  09-28-1915   DATE OF ADMISSION:  09/19/2008  DATE OF DISCHARGE:  09/26/2008                               DISCHARGE SUMMARY   CHIEF COMPLAINT:  Generalized weakness.   HOSPITAL COURSE:  Ms. Rybacki was found to have severe anemia on the day  of presentation.  The iron profile was consistent with degree of iron  deficiency.  The patient had GI evaluation after her hemoglobin was  stabilized by several blood transfusions.  The patient underwent EGD  with the result negative for source of bleeding, although there was some  evidence of mild gastritis and hiatal hernia.   The colonoscopy procedure for GI blood loss was not conducted and I had  several discussions with Dr. Loreta Ave and her daughter.  The plan now is to  discharge the patient to follow up with the GI as an outpatient since  the patient has high risk for procedure at this time as per Dr. Loreta Ave.  The daughter is agreeable and aware of the plan, and in fact, she wanted  to seek a second opinion in this regards.   Her H&H remained stable, currently is 10.6.  The patient will be  discharged on iron supplementation.   HOSPITAL PROCEDURES:  CT head without contrast on the day of admission  showing age-related atrophy without evidence of focal or acute findings.   Portable chest x-ray on September 19, 2008.  Impression is,  1. Right upper extremity PICC line, tip in the lower SVC just above      the expected right atrial junction.  No pneumothorax.  2. Stable moderate cardiomegaly.   EGD done by Dr. Jeani Hawking on April 16.  Impression is,  1. Hiatal hernia.  2. Mild pyloric gastritis.   DISCHARGE DIAGNOSES:  1. Severe anemia felt secondary to chronic blood loss anemia and iron      deficiency.  2.  Chronic blood loss anemia.  3. Iron deficiency.  4. Hyperlipidemia.  5. Questionable coronary artery disease.   DISCHARGE MEDICATIONS:  1. Nu-Iron 150 mg p.o. b.i.d.  2. Cosopt eye drops.  3. Alphagan 1 drop.  4. Darvocet-N 100 p.r.n.  5. Zocor 30 mg p.o. daily.  6. Diovan HCT 160/12.5 mg p.o. daily.  7. Imdur 60 mg p.o. daily.  8. Coreg 6.25 mg p.o. daily.   DISCHARGE PLAN:  The patient is discharged to follow up with Dr. Loreta Ave  and Gastroenterology as discussed with the patient's daughter.  I would  also recommend followup with his primary physician, Dr. Allyne Gee.  As per  the daughter's discussion, was taking Coumadin for pulmonary embolism  diagnosed in 2008.  Secondary to the severe anemia with element of blood  loss at this time, the Coumadin is contraindicated.  Furthermore, since  the patient has PE in 2008 without any further events, aspirin will be  enough as a secondary  prevention at this time.  I advised the daughter  to follow up with Dr. Allyne Gee for further management as needed in  regards of the Coumadin and the colonoscopy procedure.      Beckey Rutter, MD  Electronically Signed     EME/MEDQ  D:  09/25/2008  T:  09/26/2008  Job:  [id number]   cc:   Donnel Saxon, MD

## 2010-10-23 NOTE — Discharge Summary (Signed)
[name], [name]               ACCOUNT NO.:  [id number]   MEDICAL RECORD NO.:  [id number]          PATIENT TYPE:  OBV   LOCATION:  3711                         FACILITY:  [facility]   PHYSICIAN:  Michelene Gardener, MD    DATE OF BIRTH:  [birth year]   DATE OF ADMISSION:  06/03/2008  DATE OF DISCHARGE:  06/05/2008                               DISCHARGE SUMMARY   DISCHARGE DIAGNOSES:  1. Shortness of breath.  2. Questionable history of coronary artery disease.  3. Hypertension.  4. Hyperlipidemia.  5. Anemia.  6. Osteoarthritis.  7. History of pulmonary embolism in March 2008.  8. Glaucoma.  9. Chronic dizziness and the patient is on meclizine.  10.Dyslipidemia.   DISCHARGE MEDICATIONS:  New medications:  1. Ferrous sulfate 325 mg p.o. twice daily.  2. Albuterol inhaler 2 puffs q.4 h. as needed.  3. Avelox 400 mg p.o. once a day x7 days.  Old medications:  1. Cosopt Eye Drops 1 drop to each eye twice daily.  2. Alphagan 1 drop in each eye 3 times daily.  3. Omeprazole 20 mg once a day.  4. Darvocet-N 100 q.6 h. as needed.  5. Meclizine 25 mg q.6 h. as needed.  6. Nitroglycerin 0.4 mg q.5 minutes as needed.  7. Simvastatin 40 mg p.o. once daily.  8. Diovan/hydrochlorothiazide 160/12.5 mg once a day.  9. Aspirin 81 mg once a day.   CONSULTATIONS:  None.   PROCEDURES:  None.   RADIOLOGY STUDIES:  Chest x-ray on May 11, 2008, showed stable  cardiomegaly without evidence of acute problem.   FOLLOWUP:  With primary doctor within 2-3 days.   COURSE OF HOSPITALIZATION:  1. Shortness of breath.  This patient presented with increasing      shortness of breath to the hospital.  The patient was treated with      nebulizer treatment in the ER and she improved.  From the history      and physical, the patient was having some chest tightness.  During      my evaluation and when I spoke to her, she mentioned that she never      had chest pain; to me, she is a reliable historian.   The patient      was monitored in the hospital.  Telemetry monitor did not show any      acute findings.  Three sets of troponin and cardiac enzymes were      done and they came to be negative.  Her shortness of breath might      be related to bronchitis.  We will discharge her on Avelox 400 mg      once a day x7 days and I would give her albuterol inhaler as      needed.  I discussed with her and with her daughter possible      evaluation with Cardiology, but they both regret and they would      like to do this as an outpatient.  I asked them to stay to do an      echocardiogram that again family  preferred to do this as an      outpatient.  At the time of discharge, the patient is totally back      to her baseline.  She does not have any shortness of breath.  She      does not have any pain.  Oxygen was taken off and her saturation      remained in the mid to high 90s in room air.  I advised them to see      the primary physician within 2-3 days for possible evaluation for      echocardiogram versus stress test.  I also advised them to come to      the ER if she developed more chest pain or increasing shortness of      breath.  2. A questionable history of coronary artery disease.  The patient's      family denied any history of coronary artery disease and they said      the only problem is her pulmonary embolism.  I discontinued her      current medications and then asked them to follow with the primary      doctor as mentioned above.  3. History of pulmonary embolism.  The patient is on Coumadin.  Her      INR was therapeutic.  The patient was continued on the same dose of      Coumadin and again she was informed to follow with her primary      doctor as usual to check her INR and adjust her Coumadin as needed.  4. Anemia.  Her anemia profile was done and her iron was low.  The      patient was started on iron sulfate.   Otherwise, other medical conditions remained stable.    ASSESSMENT TIME:  40 minutes.      Michelene Gardener, MD  Electronically Signed     NAE/MEDQ  D:  06/05/2008  T:  06/05/2008  Job:  [id number]

## 2010-10-24 ENCOUNTER — Other Ambulatory Visit: Payer: Medicare HMO

## 2010-10-26 NOTE — Discharge Summary (Signed)
[name], [name]               ACCOUNT NO.:  [id number]   MEDICAL RECORD NO.:  [id number]          PATIENT TYPE:  INP   LOCATION:  3735                         FACILITY:  [facility]   PHYSICIAN:  Herbie Saxon, MDDATE OF BIRTH:  1916/02/06   DATE OF PROCEDURE:  DATE OF DISCHARGE:  09/14/2006                    STAT - MUST CHANGE TO CORRECT WORK TYPE   DISCHARGE DIAGNOSES:  1. Acute pulmonary embolism.  2. Hypertension, stabl  3 History of glaucoma.  4 History of CVA in 2001.  5 History of osteoarthritis.   HOSPITAL COURSE:  This 77 year old African-American lady was having  chest pain which was pleuritic with sudden development of shortness of  breath.  On presentation the CT of the chest did show an acute PE to the  left lower lobe with tiny lobe nodules in both upper lobes.  The patient  was started on heparin and Coumadin.  At present the Lovenox and  Coumadin were overlapping.  INR is therapeutic at 2.3.  She developed diarrhea two days after admission and stool workup has  been sent for C. diff.  She was started on Flagyl.  Diarrhea is most  improved.  Reflux symptoms are improved with Carafate, Reglan and  appetite has been improved with Megace.  She had a echocardiogram report  pending  There is no bradycardia at present.  She has been discharged  home in stable condition to follow up with her primary care physician,  Dr. Dorothyann Peng, in a week.  The PT/INR is to be monitored at least  twice weekly to keep the INR in the therapeutic range of 2-3.  Primary  care physician knows to follow up the C. diff toxin as outpatient.  Follow-up with Cardiology, Dr. Nanetta Batty, in three to four weeks.   DISCHARGE MEDICATIONS:  Coumadin 2.5 mg nightly, Reglan 5 mg p.o.  t.i.d., Carafate 1 gram p.o. t.i.d., Megace 40 mg daily, Flagyl 400 mg  p.o. t.i.d. for three more days, Tussionex 5 mL p.o. b.i.d. for two  days, continue with her home medications of Cosopt eye drops one  drop  twice daily.  Darvocet-N 100 650 q. 8h. p.r.n., Prilosec 20 mg b.i.d., ,  Diovan 150/12.5 mg daily, Alphagan eye drop one drop three times daily,  nitroglycerin 0.4 mg sublingual p.r.n., Zocor 40 mg daily, aspirin 81 mg  daily,    Diet to be low-sodium, heart-healthy, low-cholesterol, activity.  She is  to begin activity slowly.   PHYSICAL EXAMINATION:  On examination today she is an elderly lady not  in acute distress.  Temperature is 97, pulse 80, respiratory rate is 20,  blood pressure is 124/56.  Mildly pale, not jaundiced.  Her neck is  supple.  .  She is alert, oriented times three.  Peripheral pulses  present.  No pedal edema.   LABORATORY DATA:  Sodium of 135, potassium 3.8, chloride 111,  bicarbonate 20, glucose 85, BUN 8, creatinine 0.94.  The INR is 2.3.  WBC 6.9, hematocrit 33, platelet count is 179.   RADIOLOGY:  CT angiogram from admission shows acute pulmonary embolus to  left lower lobe pulmonary artery,  upper lobe nodules.  A follow-up CT  exam is recommended in 6-12 months.  Chest x-ray on admission shows  cardiomegaly but no heart failure.  She also has scoliosis.  __________      Herbie Saxon, MD  Electronically Signed     MIO/MEDQ  D:  09/14/2006  T:  09/14/2006  Job:  [id number]

## 2010-10-26 NOTE — Cardiovascular Report (Signed)
[place]  Patient:    [name], [name]                        MRN: [id number] Proc. Date: 07/30/00 Adm. Date:  [id number] Disc. Date: [id number] Attending:  Berry, Jonathan [place] CC:         Cardiac Catheterization Laboratory  Velna Hatchet, M.D., [facility]  [facility], [address] [place], [place] [address]   Cardiac Catheterization  INDICATIONS:  [name] is an 77 year old African-American female, who was recently admitted to a hospital in [place] with chest pain, atrial fibrillation and ventricular tachycardia.  She had a normal 2-D echocardiogram and Cardiolite stress test.  She was placed on amiodarone.  She does complain of occasional substernal chest pressure.  She presents now for diagnostic coronary arteriography to rule out ischemic etiology for her ventricular tachycardia.  DESCRIPTION OF PROCEDURE:  The patient was brought to the second floor [facility] in the postabsorptive state.  She was premedicated with p.o. Valium.  Her right groin was prepped and shaved in the usual sterile fashion.  Xylocaine 1% was used for local anesthesia.  A 6 French sheath was inserted into the right femoral artery using standard Seldinger technique.  A 6 French right and left diagnostic catheter as well as a 6 French pigtail catheter were used for selective coronary angiography, left ventriculography, respectively.  Omnipaque dye was used for the entirety of the case.  Retrograde, aortic, left ventricular, and pullback pressures were recorded.  HEMODYNAMICS: 1. Aortic systolic pressure 168, diastolic pressure 62. 2. Left ventricular systolic pressure 174, and diastolic pressure 9.  SELECTIVE CORONARY ANGIOGRAPHY: 1. Left main:  Normal. 2. Left anterior descending:  The LAD had minor irregularities noted. 3. Left circumflex:  This is a small nondominant  vessel and was free of    significant disease. 4. Ramus intermedius branch:  This is a moderate to large vessel without    significant disease. 5. Right coronary artery:  This is a large dominant vessel that is fairly    tortuous and had a 40% segmental mid stenosis.  LEFT VENTRICULOGRAPHY:  The RAO left ventriculogram was performed using 25 cc of Omnipaque dye at 12 cc/sec.  The overall LV EF was estimated at greater than 60% without focal wall motion abnormalities.  IMPRESSION:  [name] has essentially normal coronary arteries and normal left ventricular function.  I do not think her ventricular tachycardia is ischemically mediated nor is her chest pain.  Sheaths were removed and pressure was held on the groin to achieve hemostasis. The patient left the lab in stable condition.  She will be discharged home later today as an outpatient and will see me back in the office in one to two weeks.  Dr. Velna Hatchet was notified of these results. DD:  07/30/00 TD:  07/31/00 Job: [id number]

## 2010-10-26 NOTE — H&P (Signed)
NAMELARITA, [name] NO.:  [id number]   MEDICAL RECORD NO.:  [id number]          PATIENT TYPE:  INP   LOCATION:  1833                         FACILITY:  [facility]   PHYSICIAN:  Michaelyn Barter, M.D. DATE OF BIRTH:  [birth year]   DATE OF ADMISSION:  09/08/2006  DATE OF DISCHARGE:                              HISTORY & PHYSICAL   PRIMARY CARE PHYSICIAN:  Robyn N. Allyne Gee, M.D.   CARDIOLOGIST:  Nanetta Batty, M.D., [facility].   CHIEF COMPLAINT:  Chest pain.   HISTORY OF PRESENT ILLNESS:  Ms. [name] is an 77 year old female with a  past medical history of hypertension who states that this morning she  woke up to go to the bathroom.  Shortly after returning to the bed, she  developed pain that radiated across the entire region of her chest.  She  states that the pain got worse whenever she would take a deep breath or  if she were to cough.  The pain was very difficult for her to describe.  There was no radiation of the pain to her neck or to her neck or to her  arm.  She did not have any shortness of breath per se.  She states that  she had a sensation of feeling very hot and became diaphoretic but that  sensation only lasted for a very short period of time.  She states that  the pain is most intense during the course of the morning but it began  to remit throughout the course of the day and currently the pain only  occurs when she coughs or takes in deep breaths.  She did feel some  nausea.  States that she had a chronic cough ever since 2006.  No fever  or chills.  Her daughter is currently at the bedside and the daughter  states that last week the patient had two episodes during which time she  became suddenly short of breath but again currently the patient denies  feeling shortness of breath.   ALLERGIES:  PENICILLIN, reaction is questionable.   HOME MEDICATIONS:  1. Cosopt eye drops one drop each eye b.i.d.  2. Propoxy N/APAP 100/650  one tablet q.6h. p.r.n.  3. Omeprazole 20 mg capsule one tablet b.i.d.  4. Meclizine 25 mg half tablet b.i.d. p.r.n.  5. Diovan 160/12.5 mg p.o. daily.  6. Alphagan ophthalmic solution one drop t.i.d.  7. NitroQuick 0.4 mg p.r.n.  8. Simvastatin 40 mg daily.  9. Aspirin 81 mg p.o. daily.   PAST MEDICAL HISTORY:  1. Hypertension.  2. Arthritis.  3. Heartburn.  4. A mini-stroke back in 2001.  5. Glaucoma.   PAST SURGICAL HISTORY:  Cataract surgery on both eyes.   FAMILY HISTORY:  Mother had heart problems, diabetes mellitus, breast  cancer.  Father had a blood clot in the brain.   SOCIAL HISTORY:  Cigarettes:  The patient denies.  The patient does dip  snuff.  Alcohol:  The patient denies.   PHYSICAL EXAMINATION:  GENERAL APPEARANCE:  The patient is awake, she is  cooperative. She does not appear to  be in any obvious distress.  VITAL SIGNS:  Temperature 97.1, blood pressure 139/67, heart rate 56,  respirations 22, O2 saturation 97%.  HEENT:  Normocephalic and atraumatic.  Anicteric.  Extraocular movements  are intact.  Pupils are equal, round and reactive to light.  Oral mucosa  is pink.  Dentures upper and lower are present.  NECK:  Supple.  No lymphadenopathy.  No JVD.  Thyroid is not palpable.  RESPIRATORY:  No crackles or wheezes.  CARDIOVASCULAR:  Distant heart sounds.  ABDOMEN:  Soft, nontender and nondistended with positive bowel sounds.  EXTREMITIES:  No leg edema.  NEUROLOGIC:  The patient is alert and oriented x3.  Cranial nerves II-  XII intact.  MUSCULOSKELETAL:  There is 5/5 upper and lower extremity strength.   LABORATORY DATA:  The patient's pH was 7.325, pCO2 43.6, bicarb 22.7.  Hemoglobin 11.9, hematocrit 35, sed rate 28.  PT 13.7, INR 1.  D-dimer  1.31.  Sodium 139, potassium 4, chloride 109, glucose 100, BUN 19,  creatinine 1.1.  CK-MB, POC is 1.4 and 1.1.  Troponin I, POC is less  than 0.05 x2.  Myoglobin, POC is 96.6 and 145.   Patient also had an EKG  completed which revealed sinus bradycardia.  No  Q-waves are present.  Some nonspecific T-wave changes within leads V5,  V6.   A CT scan of the chest was completed.  It reveals an acute PE to the  left lower lobe, tiny nodules in both upper lobes are seen.  The  radiologist recommends follow-up CT within the next 6-12 months.  Chest  x-ray reveals cardiomegaly, no failure.   ASSESSMENT/PLAN:  1. Acute pulmonary embolus.  Will start the patient on Lovenox      followed by Coumadin.  Will provide oxygen and p.r.n. pain      medications.  2. Hypertension.  The patient's blood pressure currently is stable.      Will therefore resume her previously prescribed home medications.  3. Gastrointestinal prophylaxis.  Will provide Protonix 40 mg p.o.      daily.      Michaelyn Barter, M.D.  Electronically Signed     OR/MEDQ  D:  09/08/2006  T:  09/08/2006  Job:  [id number]   cc:   Candyce Churn. Allyne Gee, M.D.  Nanetta Batty, M.D.

## 2010-10-29 ENCOUNTER — Ambulatory Visit
Admission: RE | Admit: 2010-10-29 | Discharge: 2010-10-29 | Disposition: A | Discharge status: Home / Self Care | Payer: Medicare HMO | Source: Ambulatory Visit | Attending: Family Medicine | Admitting: Family Medicine

## 2010-10-29 DIAGNOSIS — M7989 Other specified soft tissue disorders: Secondary | ICD-10-CM

## 2010-11-19 ENCOUNTER — Other Ambulatory Visit: Payer: Self-pay | Admitting: Family Medicine

## 2010-11-19 DIAGNOSIS — Z1231 Encounter for screening mammogram for malignant neoplasm of breast: Secondary | ICD-10-CM

## 2010-12-17 ENCOUNTER — Ambulatory Visit
Admission: RE | Admit: 2010-12-17 | Discharge: 2010-12-17 | Disposition: A | Discharge status: Home / Self Care | Payer: Medicare HMO | Source: Ambulatory Visit | Attending: Family Medicine | Admitting: Family Medicine

## 2010-12-17 DIAGNOSIS — Z1231 Encounter for screening mammogram for malignant neoplasm of breast: Secondary | ICD-10-CM

## 2011-03-15 LAB — POCT CARDIAC MARKERS
CKMB, poc: 1 ng/mL (ref 1.0–8.0)
Myoglobin, poc: 84.4 ng/mL (ref 12–200)
Troponin i, poc: <0.05 ng/mL (ref 0.00–0.09)

## 2011-03-15 LAB — TSH: TSH: 2.592 u[IU]/mL (ref 0.350–4.500)

## 2011-03-15 LAB — CARDIAC PANEL(CRET KIN+CKTOT+MB+TROPI)
CK, MB: 1.6 ng/mL (ref 0.3–4.0)
CK, MB: 1.6 ng/mL (ref 0.3–4.0)
Relative Index: 1.6 (ref 0.0–2.5)
Relative Index: INVALID (ref 0.0–2.5)
Total CK: 101 U/L (ref 7–177)
Total CK: 98 U/L (ref 7–177)
Troponin I: 0.01 ng/mL (ref 0.00–0.06)
Troponin I: 0.01 ng/mL (ref 0.00–0.06)

## 2011-03-15 LAB — URINALYSIS, ROUTINE W REFLEX MICROSCOPIC
Bilirubin Urine: NEGATIVE
Glucose, UA: NEGATIVE mg/dL
Ketones, ur: NEGATIVE mg/dL
Leukocytes, UA: NEGATIVE
Nitrite: NEGATIVE
Protein, ur: NEGATIVE mg/dL
Specific Gravity, Urine: 1.016 (ref 1.005–1.030)
Urobilinogen, UA: 0.2 mg/dL (ref 0.0–1.0)
pH: 5.5 (ref 5.0–8.0)

## 2011-03-15 LAB — BASIC METABOLIC PANEL
BUN: 25 mg/dL — ABNORMAL HIGH (ref 6–23)
CO2: 26 mEq/L (ref 19–32)
Calcium: 8.6 mg/dL (ref 8.4–10.5)
Chloride: 108 mEq/L (ref 96–112)
Creatinine, Ser: 1.28 mg/dL — ABNORMAL HIGH (ref 0.4–1.2)
GFR calc Af Amer: 47 mL/min — ABNORMAL LOW (ref >60)
GFR calc non Af Amer: 39 mL/min — ABNORMAL LOW (ref >60)
Glucose, Bld: 107 mg/dL — ABNORMAL HIGH (ref 70–99)
Potassium: 4 mEq/L (ref 3.5–5.1)
Sodium: 138 mEq/L (ref 135–145)

## 2011-03-15 LAB — CBC
HCT: 31.9 % — ABNORMAL LOW (ref 36.0–46.0)
Hemoglobin: 10.6 g/dL — ABNORMAL LOW (ref 12.0–15.0)
MCHC: 33.1 g/dL (ref 30.0–36.0)
MCV: 95.8 fL (ref 78.0–100.0)
Platelets: 164 10*3/uL (ref 150–400)
RBC: 3.33 MIL/uL — ABNORMAL LOW (ref 3.87–5.11)
RDW: 14.2 % (ref 11.5–15.5)
WBC: 4.5 10*3/uL (ref 4.0–10.5)

## 2011-03-15 LAB — LIPID PANEL
Cholesterol: 123 mg/dL (ref 0–200)
HDL: 46 mg/dL (ref >39)
LDL Cholesterol: 66 mg/dL (ref 0–99)
Total CHOL/HDL Ratio: 2.7 RATIO
Triglycerides: 55 mg/dL (ref <150)
VLDL: 11 mg/dL (ref 0–40)

## 2011-03-15 LAB — URINE CULTURE: Colony Count: 5000

## 2011-03-15 LAB — PROTIME-INR
INR: 2.3 — ABNORMAL HIGH (ref 0.00–1.49)
INR: 2.3 — ABNORMAL HIGH (ref 0.00–1.49)
Prothrombin Time: 26.5 seconds — ABNORMAL HIGH (ref 11.6–15.2)
Prothrombin Time: 26.6 seconds — ABNORMAL HIGH (ref 11.6–15.2)

## 2011-03-15 LAB — RETICULOCYTES
RBC.: 3.28 MIL/uL — ABNORMAL LOW (ref 3.87–5.11)
Retic Count, Absolute: 29.5 10*3/uL (ref 19.0–186.0)
Retic Ct Pct: 0.9 % (ref 0.4–3.1)

## 2011-03-15 LAB — URINE MICROSCOPIC-ADD ON

## 2011-03-15 LAB — MAGNESIUM: Magnesium: 2.2 mg/dL (ref 1.5–2.5)

## 2011-03-15 LAB — APTT: aPTT: 45 seconds — ABNORMAL HIGH (ref 24–37)

## 2011-03-15 LAB — IRON AND TIBC
Iron: 41 ug/dL — ABNORMAL LOW (ref 42–135)
Saturation Ratios: 16 % — ABNORMAL LOW (ref 20–55)
TIBC: 250 ug/dL (ref 250–470)
UIBC: 209 ug/dL

## 2011-03-15 LAB — FERRITIN: Ferritin: 53 ng/mL (ref 10–291)

## 2011-03-15 LAB — B-NATRIURETIC PEPTIDE (CONVERTED LAB): Pro B Natriuretic peptide (BNP): 30 pg/mL (ref 0.0–100.0)

## 2011-03-15 LAB — VITAMIN B12: Vitamin B-12: 476 pg/mL (ref 211–911)

## 2011-03-15 LAB — CK TOTAL AND CKMB (NOT AT ARMC)
CK, MB: 1.5 ng/mL (ref 0.3–4.0)
Relative Index: 1.3 (ref 0.0–2.5)
Total CK: 117 U/L (ref 7–177)

## 2011-03-15 LAB — TROPONIN I: Troponin I: 0.01 ng/mL (ref 0.00–0.06)

## 2011-03-15 LAB — FOLATE: Folate: >20 ng/mL

## 2011-08-23 ENCOUNTER — Other Ambulatory Visit: Payer: Self-pay | Admitting: Family Medicine

## 2011-08-23 DIAGNOSIS — M79605 Pain in left leg: Secondary | ICD-10-CM

## 2011-08-27 ENCOUNTER — Other Ambulatory Visit: Payer: Medicare HMO

## 2011-08-29 ENCOUNTER — Ambulatory Visit
Admission: RE | Admit: 2011-08-29 | Discharge: 2011-08-29 | Disposition: A | Discharge status: Home / Self Care | Payer: Medicare HMO | Source: Ambulatory Visit | Attending: Family Medicine | Admitting: Family Medicine

## 2011-08-29 DIAGNOSIS — M79605 Pain in left leg: Secondary | ICD-10-CM

## 2012-01-07 ENCOUNTER — Other Ambulatory Visit: Payer: Self-pay | Admitting: Family Medicine

## 2012-01-07 DIAGNOSIS — Z1231 Encounter for screening mammogram for malignant neoplasm of breast: Secondary | ICD-10-CM

## 2012-01-22 ENCOUNTER — Ambulatory Visit
Admission: RE | Admit: 2012-01-22 | Discharge: 2012-01-22 | Disposition: A | Discharge status: Home / Self Care | Payer: Medicare HMO | Source: Ambulatory Visit | Attending: Family Medicine | Admitting: Family Medicine

## 2012-01-22 DIAGNOSIS — Z1231 Encounter for screening mammogram for malignant neoplasm of breast: Secondary | ICD-10-CM

## 2012-05-26 IMAGING — CR DG CHEST 2V
2 series · 2 of 2 positions shown · non-contrast
Comparison: Two-view chest x-ray 10/14/2009, 06/14/2009, and
11/27/2007.

CLINICAL DATA: Follow up pneumonia.  Cough.

CHEST - 2 VIEW 12/15/2009:

[view not recorded (1 of 2)]
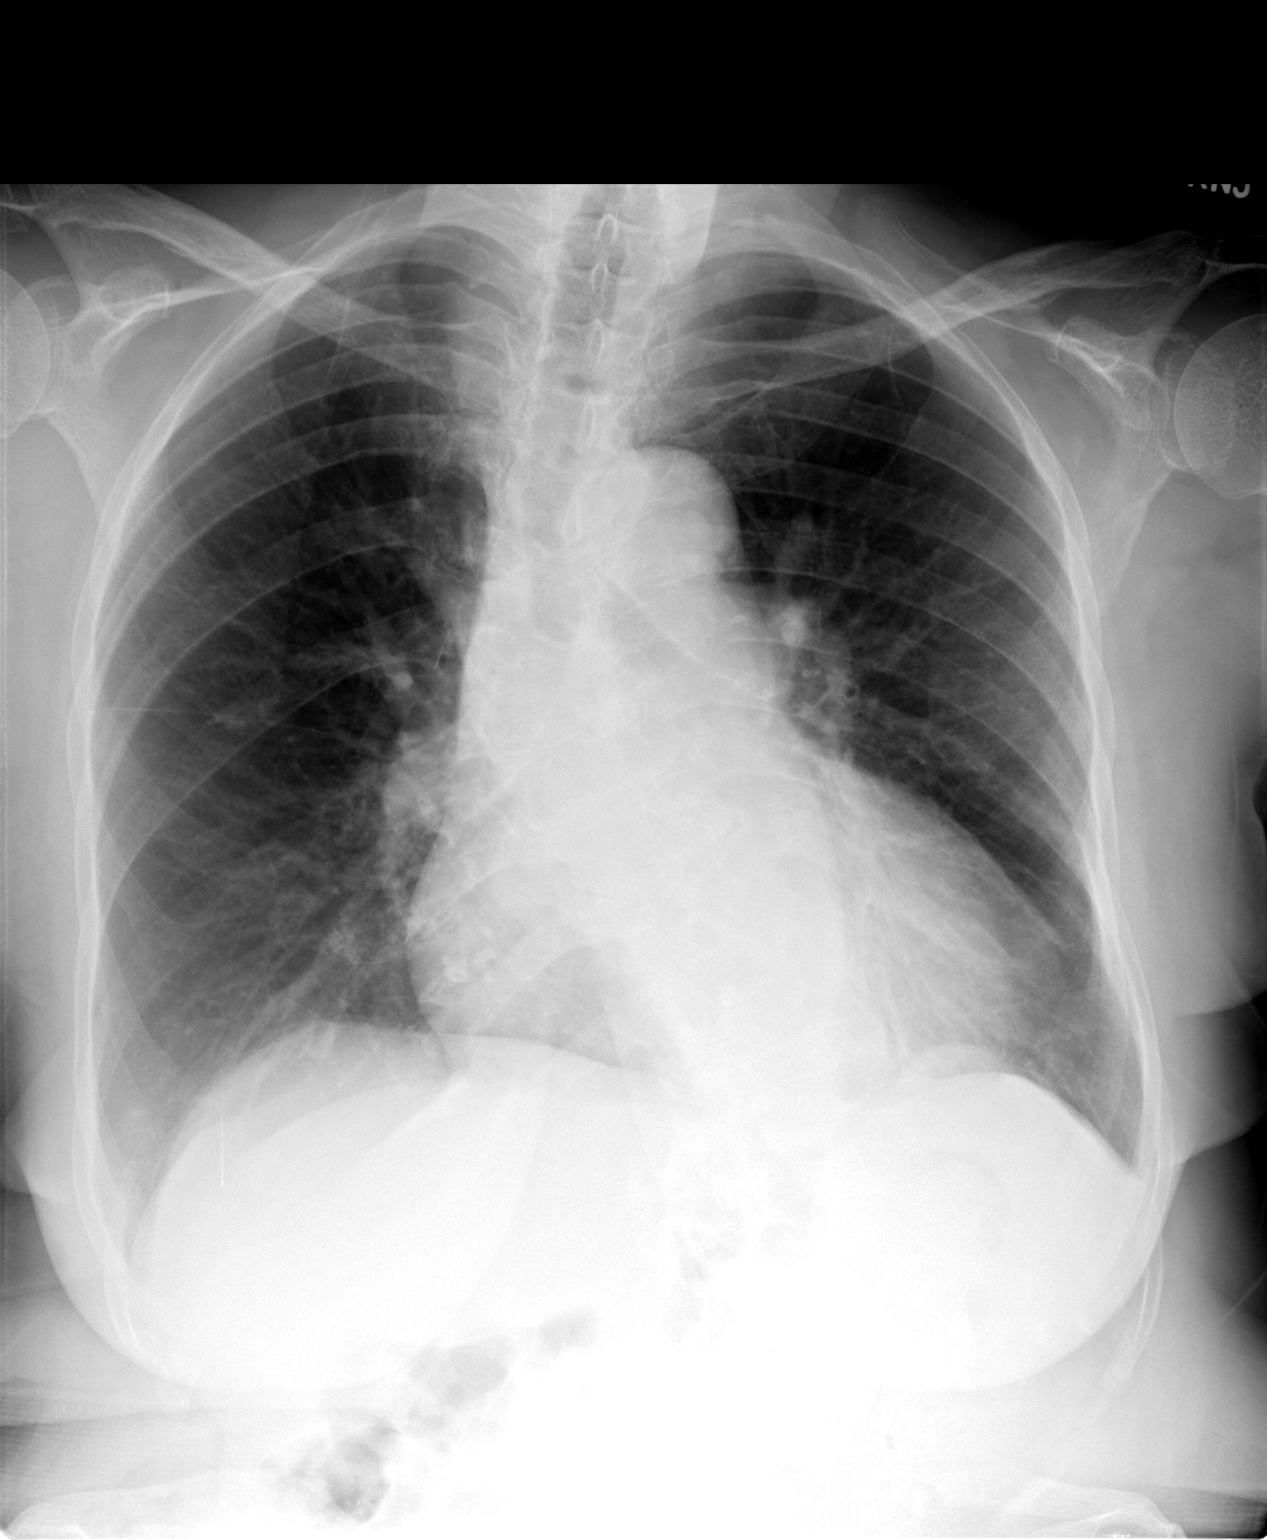

[view not recorded (2 of 2)]
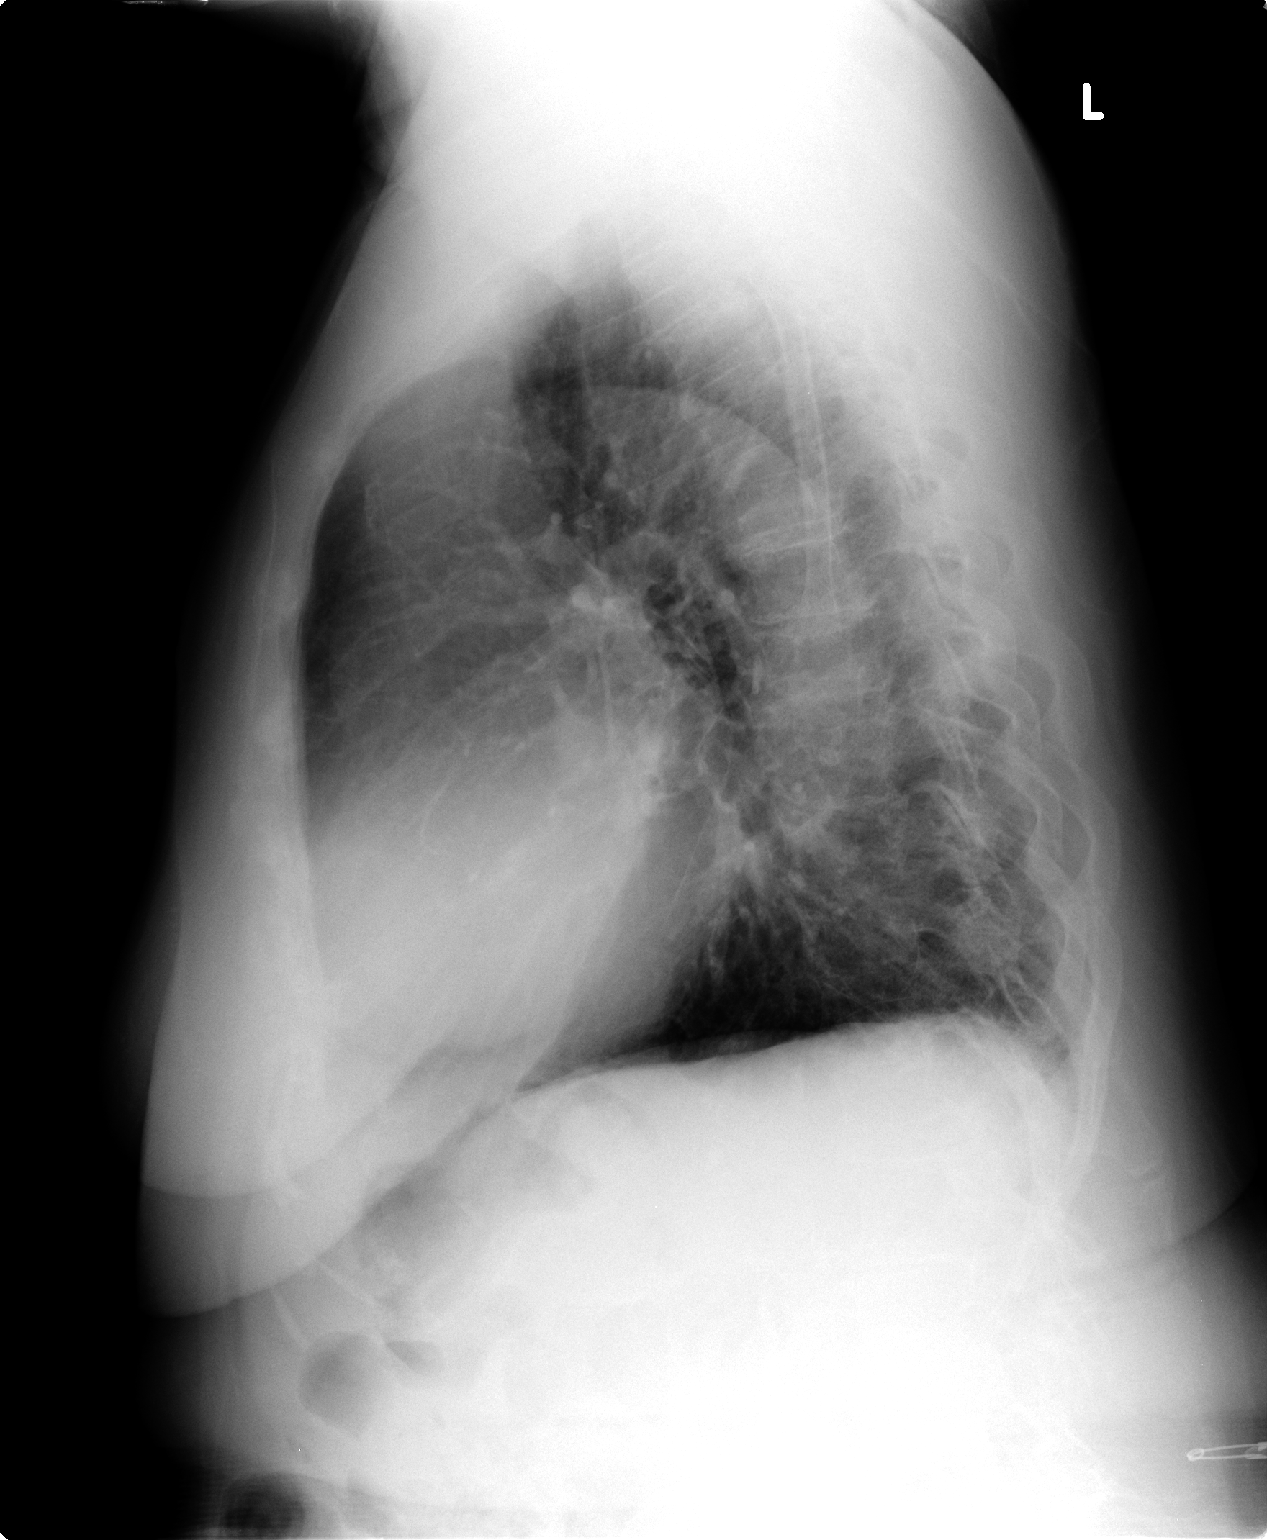

[2 of 2 positions shown; findings below may reference images not displayed]

FINDINGS: Heart enlarged but stable.  Interval resolution of the
airspace consolidation in the lingula and left upper lobe.  Perhaps
mild residual scarring.  Stable linear scarring inferiorly in the
right upper lobe.  Lungs otherwise clear.  No pleural effusions.
Thoracic aorta tortuous and atherosclerotic, unchanged.  Hilar and
mediastinal contours otherwise unremarkable.  Thoracolumbar
scoliosis convex left.
IMPRESSION: Interval resolution of the lingular/left upper lobe pneumonia since
the examination 2 months ago.  Stable cardiomegaly.  No acute
cardiopulmonary disease currently.

## 2014-06-09 ENCOUNTER — Telehealth: Payer: Self-pay | Admitting: Cardiovascular Disease

## 2014-06-09 NOTE — Telephone Encounter (Signed)
Closed encounter °

## 2014-06-09 NOTE — Telephone Encounter (Signed)
[facility]Dr [facility]ury [facility]J [facility] for appointment with Dr Allyson SabalBerry on 06/22/14.  [facility] Dr Hazle CocaBerry's schedule on 06/22/14.  lp

## 2014-06-16 ENCOUNTER — Ambulatory Visit (INDEPENDENT_AMBULATORY_CARE_PROVIDER_SITE_OTHER): Payer: Commercial Managed Care - HMO | Admitting: Cardiovascular Disease

## 2014-06-16 ENCOUNTER — Encounter: Payer: Self-pay | Admitting: Cardiovascular Disease

## 2014-06-16 VITALS — BP 186/80 | HR 72 | Ht 60.0 in | Wt 149.4 lb

## 2014-06-16 DIAGNOSIS — I499 Cardiac arrhythmia, unspecified: Secondary | ICD-10-CM

## 2014-06-16 DIAGNOSIS — L97529 Non-pressure chronic ulcer of other part of left foot with unspecified severity: Secondary | ICD-10-CM | POA: Insufficient documentation

## 2014-06-16 DIAGNOSIS — S81809A Unspecified open wound, unspecified lower leg, initial encounter: Secondary | ICD-10-CM

## 2014-06-16 NOTE — Progress Notes (Signed)
06/16/2014 [name]   04/20/1916  [id number]  Primary Physician Geraldo Pitter, MD Primary Cardiologist: Runell Gess MD [name]   HPI:  [name] is a 81 year old mildly overweight widowed African-American female mother of 3 living children referred by Dr. Elvin So for peripheral vascular evaluation. Her primary care physician is Dr. Parke Simmers. She was referred for a slowly healing left lateral ankle ulcer which has been there for at least 7 years. He recently became a little bit more problematic. Her vascular risk factors include treated hypertension and hyperlipidemia. She has never had a heart attack or stroke. She denies chest pain or shortness of breath. She's had an ulcer on her left lateral ankle for at least 7 years which has recently gotten more noticeable and painful. She does have palpable pedal pulses.   Current Outpatient Prescriptions  Medication Sig Dispense Refill  . BYSTOLIC 5 MG tablet Take 5 mg by mouth daily.     . Diclofenac Sodium (PENNSAID) 2 % SOLN Place onto the skin 2 (two) times daily.    Marland Kitchen latanoprost (XALATAN) 0.005 % ophthalmic solution     . Memantine HCl-Donepezil HCl (NAMZARIC) 28-10 MG CP24 Take by mouth daily.    Marland Kitchen omeprazole (PRILOSEC) 20 MG capsule Take 20 mg by mouth daily.     . predniSONE (DELTASONE) 5 MG tablet Take 5 mg by mouth daily with breakfast. Tak 1 1/2 tablet by mouth once daily in the morning.    . simvastatin (ZOCOR) 40 MG tablet Take 40 mg by mouth daily.     . valsartan-hydrochlorothiazide (DIOVAN-HCT) 320-12.5 MG per tablet Take 1 tablet by mouth daily.      No current facility-administered medications for this visit.    Allergies  Allergen Reactions  . Penicillins     REACTION: unknown    History   Social History  . Marital Status: Widowed    Spouse Name: N/A    Number of Children: N/A  . Years of Education: N/A   Occupational History  . Not on file.   Social History Main Topics  . Smoking  status: Never Smoker   . Smokeless tobacco: Not on file  . Alcohol Use: No  . Drug Use: No  . Sexual Activity: Not on file   Other Topics Concern  . Not on file   Social History Narrative  . No narrative on file     Review of Systems: General: negative for chills, fever, night sweats or weight changes.  Cardiovascular: negative for chest pain, dyspnea on exertion, edema, orthopnea, palpitations, paroxysmal nocturnal dyspnea or shortness of breath Dermatological: negative for rash Respiratory: negative for cough or wheezing Urologic: negative for hematuria Abdominal: negative for nausea, vomiting, diarrhea, bright red blood per rectum, melena, or hematemesis Neurologic: negative for visual changes, syncope, or dizziness All other systems reviewed and are otherwise negative except as noted above.    Blood pressure 186/80, pulse 72, height 5' (1.524 m), weight 149 lb 6.4 oz (67.767 kg).  General appearance: alert and no distress Neck: no adenopathy, no JVD, supple, symmetrical, trachea midline, thyroid not enlarged, symmetric, no tenderness/mass/nodules and soft bilateral bruits versus a transmitted murmur Lungs: clear to auscultation bilaterally Heart: 2/6 outflow tract murmur consistent with aortic stenosis Extremities: palpable pedal pulses bilaterally, small shallow ulcer lateral left ankle.  EKG not performed today  ASSESSMENT AND PLAN:   Ulcer of left foot The patient was referred by Dr. Elvin So, podiatrist, for peripheral vascular evaluation because of a  slowly healing ulcer on her lateral left ankle. This is been there for years. He recently became slightly worse and is somewhat painful. Patient denies claudication. Her vascular risk factor profile is notable for treated hypertension and hyperlipidemia. She denies claudication.      Runell GessJonathan J. Ardath Lepak MD FACP,FACC,FAHA, [facility] 06/16/2014 3:19 PM

## 2014-06-16 NOTE — Assessment & Plan Note (Signed)
The patient was referred by Dr. Elvin SoPetery, podiatrist, for peripheral vascular evaluation because of a slowly healing ulcer on her lateral left ankle. This is been there for years. He recently became slightly worse and is somewhat painful. Patient denies claudication. Her vascular risk factor profile is notable for treated hypertension and hyperlipidemia. She denies claudication.

## 2014-06-16 NOTE — Patient Instructions (Signed)
  We will see you back in follow up as needed.   Dr Berry has ordered lower extremity arterial doppler- During this test, ultrasound is used to evaluate arterial blood flow in the legs. Allow approximately one hour for this exam.      

## 2014-06-21 ENCOUNTER — Emergency Department (HOSPITAL_COMMUNITY): Payer: Commercial Managed Care - HMO

## 2014-06-21 ENCOUNTER — Emergency Department (HOSPITAL_COMMUNITY)
Admission: EM | Admit: 2014-06-21 | Discharge: 2014-06-21 | Disposition: A | Discharge status: Home / Self Care | Payer: Commercial Managed Care - HMO | Attending: Emergency Medicine | Admitting: Emergency Medicine

## 2014-06-21 ENCOUNTER — Encounter (HOSPITAL_COMMUNITY): Payer: Self-pay

## 2014-06-21 DIAGNOSIS — R05 Cough: Secondary | ICD-10-CM | POA: Diagnosis not present

## 2014-06-21 DIAGNOSIS — Z79899 Other long term (current) drug therapy: Secondary | ICD-10-CM | POA: Insufficient documentation

## 2014-06-21 DIAGNOSIS — R011 Cardiac murmur, unspecified: Secondary | ICD-10-CM | POA: Insufficient documentation

## 2014-06-21 DIAGNOSIS — Z880 Allergy status to penicillin: Secondary | ICD-10-CM | POA: Diagnosis not present

## 2014-06-21 DIAGNOSIS — R059 Cough, unspecified: Secondary | ICD-10-CM

## 2014-06-21 DIAGNOSIS — R197 Diarrhea, unspecified: Secondary | ICD-10-CM | POA: Diagnosis not present

## 2014-06-21 DIAGNOSIS — E785 Hyperlipidemia, unspecified: Secondary | ICD-10-CM | POA: Insufficient documentation

## 2014-06-21 DIAGNOSIS — Z872 Personal history of diseases of the skin and subcutaneous tissue: Secondary | ICD-10-CM | POA: Insufficient documentation

## 2014-06-21 DIAGNOSIS — F0390 Unspecified dementia without behavioral disturbance: Secondary | ICD-10-CM | POA: Insufficient documentation

## 2014-06-21 DIAGNOSIS — I10 Essential (primary) hypertension: Secondary | ICD-10-CM | POA: Diagnosis not present

## 2014-06-21 DIAGNOSIS — K644 Residual hemorrhoidal skin tags: Secondary | ICD-10-CM | POA: Diagnosis not present

## 2014-06-21 HISTORY — DX: Unspecified dementia, unspecified severity, without behavioral disturbance, psychotic disturbance, mood disturbance, and anxiety: F03.90

## 2014-06-21 LAB — CBC WITH DIFFERENTIAL/PLATELET
Basophils Absolute: 0 10*3/uL (ref 0.0–0.1)
Basophils Relative: 0 % (ref 0–1)
Eosinophils Absolute: 0.1 10*3/uL (ref 0.0–0.7)
Eosinophils Relative: 2 % (ref 0–5)
HCT: 37.9 % (ref 36.0–46.0)
Hemoglobin: 12.5 g/dL (ref 12.0–15.0)
Lymphocytes Relative: 32 % (ref 12–46)
Lymphs Abs: 2.4 10*3/uL (ref 0.7–4.0)
MCH: 31 pg (ref 26.0–34.0)
MCHC: 33 g/dL (ref 30.0–36.0)
MCV: 94 fL (ref 78.0–100.0)
Monocytes Absolute: 0.7 10*3/uL (ref 0.1–1.0)
Monocytes Relative: 9 % (ref 3–12)
Neutro Abs: 4.4 10*3/uL (ref 1.7–7.7)
Neutrophils Relative %: 57 % (ref 43–77)
Platelets: 166 10*3/uL (ref 150–400)
RBC: 4.03 MIL/uL (ref 3.87–5.11)
RDW: 13.7 % (ref 11.5–15.5)
WBC: 7.5 10*3/uL (ref 4.0–10.5)

## 2014-06-21 LAB — BASIC METABOLIC PANEL
Anion gap: 8 (ref 5–15)
BUN: 16 mg/dL (ref 6–23)
CO2: 27 mmol/L (ref 19–32)
Calcium: 9.1 mg/dL (ref 8.4–10.5)
Chloride: 103 mEq/L (ref 96–112)
Creatinine, Ser: 1.23 mg/dL — ABNORMAL HIGH (ref 0.50–1.10)
GFR calc Af Amer: 42 mL/min — ABNORMAL LOW (ref >90)
GFR calc non Af Amer: 36 mL/min — ABNORMAL LOW (ref >90)
Glucose, Bld: 92 mg/dL (ref 70–99)
Potassium: 3.3 mmol/L — ABNORMAL LOW (ref 3.5–5.1)
Sodium: 138 mmol/L (ref 135–145)

## 2014-06-21 LAB — POC OCCULT BLOOD, ED: Fecal Occult Bld: NEGATIVE

## 2014-06-21 NOTE — ED Provider Notes (Signed)
CSN: [id number]     Arrival date & time 06/21/14  1557 History   First MD Initiated Contact with Patient 06/21/14 1725     Chief Complaint  Patient presents with  . Diarrhea     (Consider location/radiation/quality/duration/timing/severity/associated sxs/prior Treatment) HPI Comments: Patient with history of dementia presents with complaint of one episode of diarrhea prior to arrival today. Patient was unable to control her bowels. She states that the stool just ran out. She had no abdominal pain. No fever, nausea or vomiting. No past history of abdominal surgeries or infections. No urinary problems. Patient is at her normal baseline state per her daughter who is at bedside. No recent antibiotic use. Patient is ambulatory at baseline. No other complaints. No further stooling in ED. The onset of this condition was acute. The course is constant. Aggravating factors: none. Alleviating factors: none.    Patient is a 81 y.o. female presenting with diarrhea. The history is provided by the patient and medical records.  Diarrhea Associated symptoms: no abdominal pain, no fever, no headaches, no myalgias and no vomiting     Past Medical History  Diagnosis Date  . Foot ulcer, left     lateral aspect on her ankle  . Hypertension   . Hyperlipidemia   . Dementia    History reviewed. No pertinent past surgical history. History reviewed. No pertinent family history. History  Substance Use Topics  . Smoking status: Never Smoker   . Smokeless tobacco: Not on file  . Alcohol Use: No   OB History    No data available     Review of Systems  Constitutional: Negative for fever.  HENT: Negative for rhinorrhea and sore throat.   Eyes: Negative for redness.  Respiratory: Negative for cough.   Cardiovascular: Negative for chest pain.  Gastrointestinal: Positive for diarrhea. Negative for nausea, vomiting and abdominal pain.  Genitourinary: Negative for dysuria.  Musculoskeletal: Negative for  myalgias.  Skin: Negative for rash.  Neurological: Negative for headaches.    Allergies  Penicillins  Home Medications   Prior to Admission medications   Medication Sig Start Date End Date Taking? Authorizing Provider  BYSTOLIC 5 MG tablet Take 5 mg by mouth daily.  05/12/14   Historical Provider, MD  Diclofenac Sodium (PENNSAID) 2 % SOLN Place onto the skin 2 (two) times daily.    Historical Provider, MD  latanoprost (XALATAN) 0.005 % ophthalmic solution  05/09/14   Historical Provider, MD  Memantine HCl-Donepezil HCl (NAMZARIC) 28-10 MG CP24 Take by mouth daily.    Historical Provider, MD  omeprazole (PRILOSEC) 20 MG capsule Take 20 mg by mouth daily.  05/13/14   Historical Provider, MD  predniSONE (DELTASONE) 5 MG tablet Take 5 mg by mouth daily with breakfast. Tak 1 1/2 tablet by mouth once daily in the morning. 05/11/14   Historical Provider, MD  simvastatin (ZOCOR) 40 MG tablet Take 40 mg by mouth daily.  05/11/14   Historical Provider, MD  valsartan-hydrochlorothiazide (DIOVAN-HCT) 320-12.5 MG per tablet Take 1 tablet by mouth daily.  05/11/14   Historical Provider, MD   BP 174/65 mmHg  Pulse 41  Temp(Src) 98.5 F (36.9 C) (Oral)  Resp 21  SpO2 92%   Physical Exam  Constitutional: She is oriented to person, place, and time. She appears well-developed and well-nourished.  HENT:  Head: Normocephalic and atraumatic.  Right Ear: Tympanic membrane, external ear and ear canal normal.  Left Ear: Tympanic membrane, external ear and ear canal normal.  Nose: Nose  normal.  Mouth/Throat: Uvula is midline, oropharynx is clear and moist and mucous membranes are normal.  Eyes: Conjunctivae, EOM and lids are normal. Pupils are equal, round, and reactive to light. Right eye exhibits no discharge. Left eye exhibits no discharge. Right eye exhibits no nystagmus. Left eye exhibits no nystagmus.  Neck: Normal range of motion. Neck supple.  Cardiovascular: Normal rate and regular rhythm.   Murmur  (systolic) heard. Pulmonary/Chest: Effort normal and breath sounds normal.  Abdominal: Soft. There is no tenderness.  Genitourinary: Rectal exam shows external hemorrhoid. Rectal exam shows no internal hemorrhoid, no mass and no tenderness. Guaiac negative stool.  Musculoskeletal:       Cervical back: She exhibits normal range of motion, no tenderness and no bony tenderness.  Neurological: She is alert and oriented to person, place, and time. She has normal strength and normal reflexes. No cranial nerve deficit or sensory deficit. She displays a negative Romberg sign. Coordination and gait normal. GCS eye subscore is 4. GCS verbal subscore is 5. GCS motor subscore is 6.  No saddle paresthesias.  Skin: Skin is warm and dry.  Psychiatric: She has a normal mood and affect.  Nursing note and vitals reviewed.   ED Course  Procedures (including critical care time) Labs Review Labs Reviewed  BASIC METABOLIC PANEL - Abnormal; Notable for the following:    Potassium 3.3 (*)    Creatinine, Ser 1.23 (*)    GFR calc non Af Amer 36 (*)    GFR calc Af Amer 42 (*)    All other components within normal limits  CBC WITH DIFFERENTIAL  POC OCCULT BLOOD, ED    Imaging Review Dg Chest 2 View  06/21/2014   CLINICAL DATA:  Cough.  EXAM: CHEST  2 VIEW  COMPARISON:  December 15, 2009.  FINDINGS: Stable cardiomegaly. Stable mild central pulmonary vascular congestion. No pneumothorax or pleural effusion is noted. Degenerative change of mid thoracic spine is noted. No acute pulmonary disease is noted. Bony thorax appears intact.  IMPRESSION: Stable cardiomegaly and mild central pulmonary vascular congestion. No acute cardiopulmonary abnormality seen.   Electronically Signed   By: Roque LiasJames  Green M.D.   On: 06/21/2014 19:36     EKG Interpretation   Date/Time:  Tuesday June 21 2014 18:33:53 EST Ventricular Rate:  72 PR Interval:  155 QRS Duration: 139 QT Interval:  468 QTC Calculation: 512 R Axis:   -65 Text  Interpretation:  Sinus rhythm Supraventricular bigeminy Probable left  atrial enlargement Left bundle branch block Bigeminy, LBBB Confirmed by  DOCHERTY  MD, MEGAN ([id number]) on 06/21/2014 8:06:45 PM       5:55 PM Patient seen and examined. Work-up initiated. Rectal exam performed with nurse chaperone. No gross blood.   Vital signs reviewed and are as follows: BP 174/65 mmHg  Pulse 41  Temp(Src) 98.5 F (36.9 C) (Oral)  Resp 21  SpO2 92%  8:01 PM Patient discussed with and seen by Dr. Micheline Mazeocherty earlier. EKG ordered due to bradycardia in ED and CXR ordered due to cough.   Work-up is unremarkable. Patient has had no additional stooling well in emergency department. Daughter and patient informed of all results. Encouraged PCP follow-up in the next several days. The patient was urged to return to the Emergency Department immediately with worsening of current symptoms, worsening abdominal pain, persistent vomiting, blood noted in stools, fever, or any other concerns. The patient verbalized understanding.    MDM   Final diagnoses:  Cough  Diarrhea  Patient with episode of diarrhea/fecal incontinence today x 1. No abdominal pain. No fevers. Do not suspect C. difficile. Patient has no neurological deficits to suggest cauda equina or stroke. She is mentating any bleeding at her baseline. Patient has PCP follow-up. She is stable during emergency department stay. No indications for admission workup.  No dangerous or life-threatening conditions suspected or identified by history, physical exam, and by work-up. No indications for hospitalization identified.      Renne Crigler, PA-C 06/21/14 2055  Toy Cookey, MD 06/22/14 [phone]

## 2014-06-21 NOTE — Discharge Instructions (Signed)
Please read and follow all provided instructions.  Your diagnoses today include:  1. Diarrhea   2. Cough     Tests performed today include:  Blood counts and electrolytes  Blood tests to check liver and kidney function  Urine test to look for infection  Chest x-ray - negative for infection  Vital signs. See below for your results today.   Medications prescribed:   None Take any prescribed medications only as directed.  Home care instructions:   Follow any educational materials contained in this packet.  Follow-up instructions: Please follow-up with your primary care provider in the next 3 days for further evaluation of your symptoms.    Return instructions:  SEEK IMMEDIATE MEDICAL ATTENTION IF:  The pain does not go away or becomes severe   A temperature above 101F develops   Repeated vomiting occurs (multiple episodes)   The pain becomes localized to portions of the abdomen. The right side could possibly be appendicitis. In an adult, the left lower portion of the abdomen could be colitis or diverticulitis.   Blood is being passed in stools or vomit (bright red or black tarry stools)   You develop chest pain, difficulty breathing, dizziness or fainting, or become confused, poorly responsive, or inconsolable (young children)  If you have any other emergent concerns regarding your health  Additional Information: Abdominal (belly) pain can be caused by many things. Your caregiver performed an examination and possibly ordered blood/urine tests and imaging (CT scan, x-rays, ultrasound). Many cases can be observed and treated at home after initial evaluation in the emergency department. Even though you are being discharged home, abdominal pain can be unpredictable. Therefore, you need a repeated exam if your pain does not resolve, returns, or worsens. Most patients with abdominal pain don't have to be admitted to the hospital or have surgery, but serious problems like  appendicitis and gallbladder attacks can start out as nonspecific pain. Many abdominal conditions cannot be diagnosed in one visit, so follow-up evaluations are very important.  Your vital signs today were: BP 177/54 mmHg   Pulse 55   Temp(Src) 98.5 F (36.9 C) (Oral)   Resp 18   SpO2 93% If your blood pressure (bp) was elevated above 135/85 this visit, please have this repeated by your doctor within one month. --------------

## 2014-06-21 NOTE — ED Notes (Signed)
This RN present for Hemoccult collection by Josh EDPA as chaperone. Pt tolerated well.

## 2014-06-21 NOTE — ED Notes (Signed)
Patient transported to X-ray 

## 2014-06-21 NOTE — ED Notes (Signed)
Pt's daughter is in the room now and reports that the pt "went to the bathroom and had a bowel movement and then got up and could not stop, she continued to defecate"

## 2014-06-21 NOTE — ED Notes (Addendum)
Per EMS, pt had diarrhea approximately 1 hour prior to arrival to the ED. EMS stated that family reported that it was a large amount of stool. Pt denies abdominal pain and n/v. EMS also reported that pt has irregular heart beat due to heart murmur.

## 2014-06-22 ENCOUNTER — Ambulatory Visit: Payer: Medicare HMO | Admitting: Cardiovascular Disease

## 2014-06-24 ENCOUNTER — Ambulatory Visit (HOSPITAL_COMMUNITY)
Admission: RE | Admit: 2014-06-24 | Discharge: 2014-06-24 | Disposition: A | Discharge status: Home / Self Care | Payer: Commercial Managed Care - HMO | Source: Ambulatory Visit | Attending: Cardiovascular Disease | Admitting: Cardiovascular Disease

## 2014-06-24 DIAGNOSIS — X58XXXD Exposure to other specified factors, subsequent encounter: Secondary | ICD-10-CM | POA: Insufficient documentation

## 2014-06-24 DIAGNOSIS — S81809A Unspecified open wound, unspecified lower leg, initial encounter: Secondary | ICD-10-CM

## 2014-06-24 DIAGNOSIS — S81809D Unspecified open wound, unspecified lower leg, subsequent encounter: Secondary | ICD-10-CM | POA: Insufficient documentation

## 2014-06-24 NOTE — Progress Notes (Signed)
Arterial Lower Ext. Duplex Completed. Mieke Brinley, BS, RDMS, RVT  

## 2014-06-27 ENCOUNTER — Encounter: Payer: Self-pay | Admitting: *Deleted

## 2014-07-13 ENCOUNTER — Other Ambulatory Visit: Payer: Self-pay | Admitting: Podiatry

## 2014-07-19 ENCOUNTER — Ambulatory Visit: Payer: Medicare HMO | Admitting: Cardiovascular Disease

## 2014-08-29 ENCOUNTER — Encounter (HOSPITAL_BASED_OUTPATIENT_CLINIC_OR_DEPARTMENT_OTHER): Payer: Commercial Managed Care - HMO | Attending: Plastic Surgery

## 2014-08-29 DIAGNOSIS — I10 Essential (primary) hypertension: Secondary | ICD-10-CM | POA: Insufficient documentation

## 2014-08-29 DIAGNOSIS — F0390 Unspecified dementia without behavioral disturbance: Secondary | ICD-10-CM | POA: Insufficient documentation

## 2014-08-29 DIAGNOSIS — H409 Unspecified glaucoma: Secondary | ICD-10-CM | POA: Diagnosis not present

## 2014-08-29 DIAGNOSIS — L97321 Non-pressure chronic ulcer of left ankle limited to breakdown of skin: Secondary | ICD-10-CM | POA: Diagnosis not present

## 2014-09-12 ENCOUNTER — Encounter (HOSPITAL_BASED_OUTPATIENT_CLINIC_OR_DEPARTMENT_OTHER): Payer: Commercial Managed Care - HMO | Attending: Plastic Surgery

## 2014-09-12 DIAGNOSIS — L97321 Non-pressure chronic ulcer of left ankle limited to breakdown of skin: Secondary | ICD-10-CM | POA: Insufficient documentation

## 2014-10-06 DIAGNOSIS — L97321 Non-pressure chronic ulcer of left ankle limited to breakdown of skin: Secondary | ICD-10-CM | POA: Diagnosis not present

## 2014-10-17 ENCOUNTER — Encounter (HOSPITAL_BASED_OUTPATIENT_CLINIC_OR_DEPARTMENT_OTHER): Payer: Commercial Managed Care - HMO | Attending: Plastic Surgery

## 2014-10-17 DIAGNOSIS — F0390 Unspecified dementia without behavioral disturbance: Secondary | ICD-10-CM | POA: Insufficient documentation

## 2014-10-17 DIAGNOSIS — I87332 Chronic venous hypertension (idiopathic) with ulcer and inflammation of left lower extremity: Secondary | ICD-10-CM | POA: Insufficient documentation

## 2014-10-17 DIAGNOSIS — L97321 Non-pressure chronic ulcer of left ankle limited to breakdown of skin: Secondary | ICD-10-CM | POA: Diagnosis not present

## 2014-10-31 DIAGNOSIS — F0390 Unspecified dementia without behavioral disturbance: Secondary | ICD-10-CM | POA: Diagnosis not present

## 2014-10-31 DIAGNOSIS — I87332 Chronic venous hypertension (idiopathic) with ulcer and inflammation of left lower extremity: Secondary | ICD-10-CM | POA: Diagnosis not present

## 2014-10-31 DIAGNOSIS — L97321 Non-pressure chronic ulcer of left ankle limited to breakdown of skin: Secondary | ICD-10-CM | POA: Diagnosis not present

## 2014-11-21 ENCOUNTER — Encounter (HOSPITAL_BASED_OUTPATIENT_CLINIC_OR_DEPARTMENT_OTHER): Payer: Commercial Managed Care - HMO | Attending: Plastic Surgery

## 2014-11-21 DIAGNOSIS — I87312 Chronic venous hypertension (idiopathic) with ulcer of left lower extremity: Secondary | ICD-10-CM | POA: Insufficient documentation

## 2014-11-21 DIAGNOSIS — L97321 Non-pressure chronic ulcer of left ankle limited to breakdown of skin: Secondary | ICD-10-CM | POA: Diagnosis present

## 2014-12-19 ENCOUNTER — Encounter (HOSPITAL_BASED_OUTPATIENT_CLINIC_OR_DEPARTMENT_OTHER): Payer: Commercial Managed Care - HMO | Attending: Plastic Surgery

## 2014-12-19 DIAGNOSIS — L97329 Non-pressure chronic ulcer of left ankle with unspecified severity: Secondary | ICD-10-CM | POA: Diagnosis present

## 2015-06-21 ENCOUNTER — Encounter: Payer: Self-pay | Admitting: Family Medicine

## 2018-10-04 ENCOUNTER — Emergency Department (HOSPITAL_BASED_OUTPATIENT_CLINIC_OR_DEPARTMENT_OTHER)
Admission: EM | Admit: 2018-10-04 | Discharge: 2018-10-04 | Disposition: A | Discharge status: Home / Self Care | Payer: Medicare (Managed Care) | Attending: Emergency Medicine | Admitting: Emergency Medicine

## 2018-10-04 ENCOUNTER — Encounter (HOSPITAL_BASED_OUTPATIENT_CLINIC_OR_DEPARTMENT_OTHER): Payer: Self-pay | Admitting: Emergency Medicine

## 2018-10-04 ENCOUNTER — Other Ambulatory Visit: Payer: Self-pay

## 2018-10-04 ENCOUNTER — Emergency Department (HOSPITAL_BASED_OUTPATIENT_CLINIC_OR_DEPARTMENT_OTHER): Payer: Medicare (Managed Care)

## 2018-10-04 DIAGNOSIS — F0390 Unspecified dementia without behavioral disturbance: Secondary | ICD-10-CM | POA: Diagnosis not present

## 2018-10-04 DIAGNOSIS — R2243 Localized swelling, mass and lump, lower limb, bilateral: Secondary | ICD-10-CM | POA: Insufficient documentation

## 2018-10-04 DIAGNOSIS — Z79899 Other long term (current) drug therapy: Secondary | ICD-10-CM | POA: Insufficient documentation

## 2018-10-04 DIAGNOSIS — I10 Essential (primary) hypertension: Secondary | ICD-10-CM | POA: Diagnosis not present

## 2018-10-04 DIAGNOSIS — Y999 Unspecified external cause status: Secondary | ICD-10-CM | POA: Diagnosis not present

## 2018-10-04 DIAGNOSIS — W19XXXA Unspecified fall, initial encounter: Secondary | ICD-10-CM

## 2018-10-04 DIAGNOSIS — W050XXA Fall from non-moving wheelchair, initial encounter: Secondary | ICD-10-CM | POA: Insufficient documentation

## 2018-10-04 DIAGNOSIS — S61212A Laceration without foreign body of right middle finger without damage to nail, initial encounter: Secondary | ICD-10-CM | POA: Diagnosis not present

## 2018-10-04 DIAGNOSIS — Z23 Encounter for immunization: Secondary | ICD-10-CM | POA: Insufficient documentation

## 2018-10-04 DIAGNOSIS — Y939 Activity, unspecified: Secondary | ICD-10-CM | POA: Insufficient documentation

## 2018-10-04 DIAGNOSIS — Y92129 Unspecified place in nursing home as the place of occurrence of the external cause: Secondary | ICD-10-CM | POA: Diagnosis not present

## 2018-10-04 DIAGNOSIS — S6991XA Unspecified injury of right wrist, hand and finger(s), initial encounter: Secondary | ICD-10-CM | POA: Diagnosis present

## 2018-10-04 MED ORDER — TETANUS-DIPHTH-ACELL PERTUSSIS 5-2.5-18.5 LF-MCG/0.5 IM SUSP
0.5000 mL | Freq: Once | INTRAMUSCULAR | Status: AC
  Administered 2018-10-04: 0.5 mL via INTRAMUSCULAR
  Filled 2018-10-04: qty 0.5

## 2018-10-04 NOTE — ED Notes (Signed)
Patient's daughter, Carney Bern is at bedside.  3 stitches done by EDP to right middle finger.  EDP updated patient's daughter.

## 2018-10-04 NOTE — ED Notes (Signed)
ED Provider at bedside. 

## 2018-10-04 NOTE — Discharge Instructions (Signed)
Patient had 3 absorbable sutures placed that should be able to be pulled out in 1 week.  Change dressing 1 time a day

## 2018-10-04 NOTE — ED Notes (Signed)
Transport called via [facility] @ [phone]

## 2018-10-04 NOTE — ED Notes (Signed)
Report given Marcelo Baldy at [facility]

## 2018-10-04 NOTE — ED Triage Notes (Addendum)
Per ems: transport from [facility] and rehab. Staff reports she was found in partially overturned wheelchair. No reported LOC. Pt is cao x 1 at her baseline. She is here for laceration to her right middle finger. VS 162/92 HR 72 RR 18 sats 94 CBG 151 T 97.9

## 2018-10-04 NOTE — ED Provider Notes (Signed)
[facility] EMERGENCY DEPARTMENT Provider Note   CSN: [id number] Arrival date & time: 10/04/18  1330    History   Chief Complaint Chief Complaint  Patient presents with  . Fall    HPI [name] is a [age] y.o. female.     The history is provided by the EMS personnel and the nursing home. The history is limited by the absence of a caregiver.  Fall  This is a new problem. The current episode started 1 to 2 hours ago. The problem occurs constantly. The problem has been resolved. Associated symptoms comments: Laceration to the right middle finger.  No LOC or head injury.  She tipped over in her wheelchair.  Pt otherwise at her baseline.. Nothing aggravates the symptoms. Nothing relieves the symptoms.    Past Medical History:  Diagnosis Date  . Dementia (HCC)   . Foot ulcer, left (HCC)    lateral aspect on her ankle  . Hyperlipidemia   . Hypertension     Patient Active Problem List   Diagnosis Date Noted  . Ulcer of left foot (HCC) 06/16/2014  . HYPERLIPIDEMIA 12/15/2009  . HYPERTENSION 12/15/2009  . COUGH 12/15/2009  . DEEP VENOUS THROMBOPHLEBITIS, HX OF 12/15/2009    No past surgical history on file.   OB History   No obstetric history on file.      Home Medications    Prior to Admission medications   Medication Sig Start Date End Date Taking? Authorizing Provider  BYSTOLIC 5 MG tablet Take 5 mg by mouth daily.  05/12/14   [provider]  Diclofenac Sodium (PENNSAID) 2 % SOLN Place onto the skin 2 (two) times daily.    [provider]  latanoprost (XALATAN) 0.005 % ophthalmic solution  05/09/14   [provider]  Memantine HCl-Donepezil HCl (NAMZARIC) 28-10 MG CP24 Take by mouth daily.    [provider]  omeprazole (PRILOSEC) 20 MG capsule Take 20 mg by mouth daily.  05/13/14   [provider]  predniSONE (DELTASONE) 5 MG tablet Take 5 mg by mouth daily with breakfast. Tak 1 1/2 tablet by mouth once  daily in the morning. 05/11/14   [provider]  simvastatin (ZOCOR) 40 MG tablet Take 40 mg by mouth daily.  05/11/14   [provider]  valsartan-hydrochlorothiazide (DIOVAN-HCT) 320-12.5 MG per tablet Take 1 tablet by mouth daily.  05/11/14   [provider]    Family History No family history on file.  Social History Social History   Tobacco Use  . Smoking status: Never Smoker  Substance Use Topics  . Alcohol use: No    Alcohol/week: 0.0 standard drinks  . Drug use: No     Allergies   Penicillins   Review of Systems Review of Systems  Unable to perform ROS: Dementia     Physical Exam Updated Vital Signs There were no vitals taken for this visit.  Physical Exam Vitals signs and nursing note reviewed.  Constitutional:      General: She is not in acute distress.    Appearance: Normal appearance. She is normal weight.  HENT:     Head: Normocephalic and atraumatic.     Nose: Nose normal.     Mouth/Throat:     Mouth: Mucous membranes are moist.  Neck:     Musculoskeletal: Normal range of motion and neck supple. No muscular tenderness.  Cardiovascular:     Rate and Rhythm: Normal rate.  Pulmonary:     Effort:  Pulmonary effort is normal.  Abdominal:     General: Abdomen is flat. Bowel sounds are normal.     Palpations: Abdomen is soft.  Musculoskeletal:       Hands:     Right lower leg: Edema present.     Left lower leg: Edema present.  Skin:    General: Skin is warm and dry.     Capillary Refill: Capillary refill takes less than 2 seconds.  Neurological:     General: No focal deficit present.     Mental Status: She is alert.     Comments: Does not answer questions  Psychiatric:     Comments: Demented but calm and cooperative      ED Treatments / Results  Labs (all labs ordered are listed, but only abnormal results are displayed) Labs Reviewed - No data to display  EKG None  Radiology Dg Finger Middle Right  Result  Date: 10/04/2018 CLINICAL DATA:  [age] year old who sustained a laceration to the RIGHT long finger when she partially overturned her wheelchair earlier today. Initial encounter. EXAM: RIGHT MIDDLE FINGER 2+V COMPARISON:  None. FINDINGS: Soft tissue laceration on the volar surface of the long finger overlying the PIP joint. No underlying fracture or other osseous abnormality. Mild narrowing of the IP joint spaces of the long finger and the remaining visualized fingers. Mild osseous demineralization. IMPRESSION: No acute osseous abnormality. Electronically Signed   By: Hulan Saas M.D.   On: 10/04/2018 14:50    Procedures Procedures (including critical care time) LACERATION REPAIR Performed by: [employer] [facility] Authorized by: Gwyneth Sprout Consent: Verbal consent obtained. Risks and benefits: risks, benefits and alternatives were discussed Consent given by: patient Patient identity confirmed: provided demographic data Prepped and Draped in normal sterile fashion Wound explored  Laceration Location: Right middle finger  Laceration Length: 2 cm  No Foreign Bodies seen or palpated  Anesthesia: local infiltration  Local anesthetic: lidocaine 2 % with epinephrine  Anesthetic total: 2 ml  Irrigation method: syringe Amount of cleaning: standard  Skin closure: 4.0 Vicryl rapide  Number of sutures: 3  Technique: Simple interrupted  Patient tolerance: Patient tolerated the procedure well with no immediate complications.   Medications Ordered in ED Medications - No data to display   Initial Impression / Assessment and Plan / ED Course  I have reviewed the triage vital signs and the nursing notes.  Pertinent labs & imaging results that were available during my care of the patient were reviewed by me and considered in my medical decision making (see chart for details).        Elderly female being brought in by her facility today after a fall when her wheelchair  tipped over.  She had no loss of consciousness, head injury and she is currently at her baseline mental status.  Family member did present with the patient and states she is her normal self.  Patient has a laceration to the right middle finger but no evidence of tendon injury.  X-ray is negative for acute findings.  She has no other signs of injury and wound was repaired as above.  Tetanus shot updated.  Patient was discharged home with wound care.   Final Clinical Impressions(s) / ED Diagnoses   Final diagnoses:  Fall, initial encounter  Laceration of right middle finger without foreign body without damage to nail, initial encounter    ED Discharge Orders    None       Gwyneth Sprout, MD 10/04/18  1513  

## 2020-08-09 ENCOUNTER — Emergency Department (HOSPITAL_COMMUNITY): Payer: Medicare (Managed Care)

## 2020-08-09 ENCOUNTER — Inpatient Hospital Stay (HOSPITAL_COMMUNITY)
Admission: EM | Admit: 2020-08-09 | Discharge: 2020-08-15 | DRG: 522 | Disposition: A | Discharge status: Skilled Nursing Facility | Payer: Medicare (Managed Care) | Source: Skilled Nursing Facility | Attending: Student in an Organized Health Care Education/Training Program | Admitting: Student in an Organized Health Care Education/Training Program

## 2020-08-09 DIAGNOSIS — R296 Repeated falls: Secondary | ICD-10-CM | POA: Diagnosis present

## 2020-08-09 DIAGNOSIS — S72011A Unspecified intracapsular fracture of right femur, initial encounter for closed fracture: Secondary | ICD-10-CM | POA: Diagnosis not present

## 2020-08-09 DIAGNOSIS — Z888 Allergy status to other drugs, medicaments and biological substances status: Secondary | ICD-10-CM

## 2020-08-09 DIAGNOSIS — R8271 Bacteriuria: Secondary | ICD-10-CM | POA: Diagnosis present

## 2020-08-09 DIAGNOSIS — F0280 Dementia in other diseases classified elsewhere without behavioral disturbance: Secondary | ICD-10-CM | POA: Diagnosis present

## 2020-08-09 DIAGNOSIS — E559 Vitamin D deficiency, unspecified: Secondary | ICD-10-CM | POA: Diagnosis present

## 2020-08-09 DIAGNOSIS — W19XXXA Unspecified fall, initial encounter: Secondary | ICD-10-CM

## 2020-08-09 DIAGNOSIS — N179 Acute kidney failure, unspecified: Secondary | ICD-10-CM

## 2020-08-09 DIAGNOSIS — E871 Hypo-osmolality and hyponatremia: Secondary | ICD-10-CM | POA: Diagnosis present

## 2020-08-09 DIAGNOSIS — I10 Essential (primary) hypertension: Secondary | ICD-10-CM | POA: Diagnosis present

## 2020-08-09 DIAGNOSIS — M810 Age-related osteoporosis without current pathological fracture: Secondary | ICD-10-CM | POA: Diagnosis present

## 2020-08-09 DIAGNOSIS — Z66 Do not resuscitate: Secondary | ICD-10-CM | POA: Diagnosis present

## 2020-08-09 DIAGNOSIS — W06XXXA Fall from bed, initial encounter: Secondary | ICD-10-CM | POA: Diagnosis present

## 2020-08-09 DIAGNOSIS — S72009A Fracture of unspecified part of neck of unspecified femur, initial encounter for closed fracture: Secondary | ICD-10-CM

## 2020-08-09 DIAGNOSIS — I129 Hypertensive chronic kidney disease with stage 1 through stage 4 chronic kidney disease, or unspecified chronic kidney disease: Secondary | ICD-10-CM | POA: Diagnosis present

## 2020-08-09 DIAGNOSIS — D62 Acute posthemorrhagic anemia: Secondary | ICD-10-CM | POA: Diagnosis not present

## 2020-08-09 DIAGNOSIS — G301 Alzheimer's disease with late onset: Secondary | ICD-10-CM | POA: Diagnosis present

## 2020-08-09 DIAGNOSIS — E785 Hyperlipidemia, unspecified: Secondary | ICD-10-CM | POA: Diagnosis present

## 2020-08-09 DIAGNOSIS — Z20822 Contact with and (suspected) exposure to covid-19: Secondary | ICD-10-CM | POA: Diagnosis present

## 2020-08-09 DIAGNOSIS — N1832 Chronic kidney disease, stage 3b: Secondary | ICD-10-CM | POA: Diagnosis present

## 2020-08-09 DIAGNOSIS — Z880 Allergy status to penicillin: Secondary | ICD-10-CM

## 2020-08-09 DIAGNOSIS — Y92003 Bedroom of unspecified non-institutional (private) residence as the place of occurrence of the external cause: Secondary | ICD-10-CM

## 2020-08-09 DIAGNOSIS — S72001A Fracture of unspecified part of neck of right femur, initial encounter for closed fracture: Secondary | ICD-10-CM

## 2020-08-09 DIAGNOSIS — S3720XA Unspecified injury of bladder, initial encounter: Secondary | ICD-10-CM

## 2020-08-09 DIAGNOSIS — Z8616 Personal history of COVID-19: Secondary | ICD-10-CM

## 2020-08-09 NOTE — ED Triage Notes (Signed)
Pt BIB EMS from [facility]. Per staff, pt was found on floor at 2030 as unwitnessed fall. Shouts in pain with touch to R leg with EMS. Per EMS, pt c/o abdominal pain en route  Hx dementia, a&o 0 at baseline  EMS vitals: 200/102 (facility reports hx HTN) Initial palpated pulse 80, possible palpated irregular rhythm.  Initial 85% RA, placed on 2L [place]. Arrives to ED 100% RA

## 2020-08-09 NOTE — ED Provider Notes (Addendum)
[facility] EMERGENCY DEPARTMENT Provider Note   CSN: [id number] Arrival date & time: 08/09/20  2311     History Chief Complaint  Patient presents with  . Fall    Teala Daffron is a 87 y.o. female.  Pt presents to the ED today with an unwitnessed fall.  EMS said it happened at 2030.  The pt has dementia and is unable to give any hx.  The pt does seem to have pain when her right hip is touched.  The pt is not on blood thinners.         Past Medical History:  Diagnosis Date  . Dementia (HCC)   . Foot ulcer, left (HCC)    lateral aspect on her ankle  . Hyperlipidemia   . Hypertension     Patient Active Problem List   Diagnosis Date Noted  . Hip fracture (HCC) 08/10/2020  . Ulcer of left foot (HCC) 06/16/2014  . HYPERLIPIDEMIA 12/15/2009  . HYPERTENSION 12/15/2009  . COUGH 12/15/2009  . DEEP VENOUS THROMBOPHLEBITIS, HX OF 12/15/2009    No past surgical history on file.   OB History   No obstetric history on file.     No family history on file.  Social History   Tobacco Use  . Smoking status: Never Smoker  Substance Use Topics  . Alcohol use: No    Alcohol/week: 0.0 standard drinks  . Drug use: No    Home Medications Prior to Admission medications   Medication Sig Start Date End Date Taking? Authorizing Provider  BYSTOLIC 5 MG tablet Take 5 mg by mouth daily.  05/12/14   [provider]  Diclofenac Sodium (PENNSAID) 2 % SOLN Place onto the skin 2 (two) times daily.    [provider]  latanoprost (XALATAN) 0.005 % ophthalmic solution  05/09/14   [provider]  Memantine HCl-Donepezil HCl (NAMZARIC) 28-10 MG CP24 Take by mouth daily.    [provider]  omeprazole (PRILOSEC) 20 MG capsule Take 20 mg by mouth daily.  05/13/14   [provider]  predniSONE (DELTASONE) 5 MG tablet Take 5 mg by mouth daily with breakfast. Tak 1 1/2 tablet by mouth once daily in the morning. 05/11/14   [provider]  simvastatin (ZOCOR) 40 MG tablet Take 40 mg by mouth daily.  05/11/14   [provider]  valsartan-hydrochlorothiazide (DIOVAN-HCT) 320-12.5 MG per tablet Take 1 tablet by mouth daily.  05/11/14   [provider]    Allergies    Aricept [donepezil], Namenda [memantine], Penicillin g, and Penicillins  Review of Systems   Review of Systems  Unable to perform ROS: Dementia  Musculoskeletal:       Right hip pain    Physical Exam Updated Vital Signs BP (!) 167/114 (BP Location: Right Arm)   Pulse 93   Resp (!) 26   SpO2 100%   Physical Exam Vitals and nursing note reviewed.  Constitutional:      Appearance: Normal appearance.  HENT:     Head: Normocephalic and atraumatic.     Right Ear: External ear normal.     Left Ear: External ear normal.     Nose: Nose normal.     Mouth/Throat:     Mouth: Mucous membranes are moist.     Pharynx: Oropharynx is clear.  Eyes:     Extraocular Movements: Extraocular movements intact.     Conjunctiva/sclera: Conjunctivae normal.     Pupils: Pupils are equal, round, and reactive  to light.  Cardiovascular:     Rate and Rhythm: Normal rate and regular rhythm.     Pulses: Normal pulses.     Heart sounds: Normal heart sounds.  Pulmonary:     Effort: Pulmonary effort is normal.     Breath sounds: Normal breath sounds.  Abdominal:     General: Abdomen is flat. Bowel sounds are normal.     Palpations: Abdomen is soft.  Musculoskeletal:     Cervical back: Normal range of motion and neck supple.     Right hip: Tenderness present. Decreased range of motion. Decreased strength.       Legs:  Skin:    General: Skin is warm.     Capillary Refill: Capillary refill takes less than 2 seconds.  Neurological:     Mental Status: She is alert. Mental status is at baseline.  Psychiatric:        Mood and Affect: Mood normal.        Behavior: Behavior normal.     ED Results / Procedures / Treatments   Labs (all labs  ordered are listed, but only abnormal results are displayed) Labs Reviewed  BASIC METABOLIC PANEL - Abnormal; Notable for the following components:      Result Value   Sodium 131 (*)    CO2 20 (*)    BUN 32 (*)    Creatinine, Ser 1.25 (*)    Calcium 8.8 (*)    GFR, Estimated 38 (*)    All other components within normal limits  CBC WITH DIFFERENTIAL/PLATELET - Abnormal; Notable for the following components:   Hemoglobin 11.6 (*)    All other components within normal limits  RESP PANEL BY RT-PCR (FLU A&B, COVID) [id number]  URINALYSIS, ROUTINE W REFLEX MICROSCOPIC  PROTIME-INR  BASIC METABOLIC PANEL  CBC  TYPE AND SCREEN    EKG None  Radiology DG Chest 2 View  Result Date: 08/10/2020 CLINICAL DATA:  Fall EXAM: CHEST - 2 VIEW COMPARISON:  June 21, 2014 FINDINGS: The heart size and mediastinal contours are unchanged with moderate cardiomegaly. Aortic knob calcifications are seen. Both lungs are clear. The visualized skeletal structures are unremarkable. IMPRESSION: No active cardiopulmonary disease. Electronically Signed   By: Jonna Clark M.D.   On: 08/10/2020 00:14   CT Head Wo Contrast  Result Date: 08/10/2020 CLINICAL DATA:  Change in mental status EXAM: CT HEAD WITHOUT CONTRAST TECHNIQUE: Contiguous axial images were obtained from the base of the skull through the vertex without intravenous contrast. COMPARISON:  None. FINDINGS: Brain: No evidence of acute territorial infarction, hemorrhage, hydrocephalus,extra-axial collection or mass lesion/mass effect. There is dilatation the ventricles and sulci consistent with age-related atrophy. Low-attenuation changes in the deep white matter consistent with small vessel ischemia. Vascular: No hyperdense vessel or unexpected calcification. Skull: The skull is intact. No fracture or focal lesion identified. Sinuses/Orbits: The visualized paranasal sinuses and mastoid air cells are clear. The orbits and globes intact. Other: None Cervical spine:  Alignment: There is straightening of the normal cervical lordosis. Skull base and vertebrae: Visualized skull base is intact. No atlanto-occipital dissociation. The vertebral body heights are well maintained. No fracture or pathologic osseous lesion seen. Soft tissues and spinal canal: The visualized paraspinal soft tissues are unremarkable. No prevertebral soft tissue swelling is seen. The spinal canal is grossly unremarkable, no large epidural collection or significant canal narrowing. Disc levels: Multilevel cervical spine spondylosis seen with disc osteophyte complex and uncovertebral osteophytes most notable at C3-C4 with moderate neural foraminal  narrowing and mild central canal stenosis.6 Upper chest: The lung apices are clear. Thoracic inlet is within normal limits. Other: Carotid artery calcifications are seen. There is scattered aortic atherosclerosis. IMPRESSION: No acute intracranial abnormality. Findings consistent with age related atrophy and chronic small vessel ischemia No acute fracture or malalignment of the spine. Electronically Signed   By: Jonna Clark M.D.   On: 08/10/2020 00:23   CT Cervical Spine Wo Contrast  Result Date: 08/10/2020 CLINICAL DATA:  Change in mental status EXAM: CT HEAD WITHOUT CONTRAST TECHNIQUE: Contiguous axial images were obtained from the base of the skull through the vertex without intravenous contrast. COMPARISON:  None. FINDINGS: Brain: No evidence of acute territorial infarction, hemorrhage, hydrocephalus,extra-axial collection or mass lesion/mass effect. There is dilatation the ventricles and sulci consistent with age-related atrophy. Low-attenuation changes in the deep white matter consistent with small vessel ischemia. Vascular: No hyperdense vessel or unexpected calcification. Skull: The skull is intact. No fracture or focal lesion identified. Sinuses/Orbits: The visualized paranasal sinuses and mastoid air cells are clear. The orbits and globes intact. Other:  None Cervical spine: Alignment: There is straightening of the normal cervical lordosis. Skull base and vertebrae: Visualized skull base is intact. No atlanto-occipital dissociation. The vertebral body heights are well maintained. No fracture or pathologic osseous lesion seen. Soft tissues and spinal canal: The visualized paraspinal soft tissues are unremarkable. No prevertebral soft tissue swelling is seen. The spinal canal is grossly unremarkable, no large epidural collection or significant canal narrowing. Disc levels: Multilevel cervical spine spondylosis seen with disc osteophyte complex and uncovertebral osteophytes most notable at C3-C4 with moderate neural foraminal narrowing and mild central canal stenosis.6 Upper chest: The lung apices are clear. Thoracic inlet is within normal limits. Other: Carotid artery calcifications are seen. There is scattered aortic atherosclerosis. IMPRESSION: No acute intracranial abnormality. Findings consistent with age related atrophy and chronic small vessel ischemia No acute fracture or malalignment of the spine. Electronically Signed   By: Jonna Clark M.D.   On: 08/10/2020 00:23   DG Hip Unilat W or Wo Pelvis 2-3 Views Right  Result Date: 08/10/2020 CLINICAL DATA:  Fall EXAM: DG HIP (WITH OR WITHOUT PELVIS) 2-3V RIGHT COMPARISON:  None. FINDINGS: There is a slightly impacted mildly displaced fracture seen through the transcervical right femoral neck. There is slight superior subluxation of the femoral shaft. The femoral head still articulates with the acetabulum. There is diffuse osteopenia. Overlying soft tissue swelling is seen. IMPRESSION: Slightly impacted mildly displaced transcervical femoral neck fracture. Electronically Signed   By: Jonna Clark M.D.   On: 08/10/2020 00:16    Procedures Procedures   Medications Ordered in ED Medications  sodium chloride 0.9 % bolus 1,000 mL (has no administration in time range)    And  0.9 %  sodium chloride infusion  (has no administration in time range)  latanoprost (XALATAN) 0.005 % ophthalmic solution 1 drop (has no administration in time range)  mirtazapine (REMERON) tablet 7.5 mg (has no administration in time range)  carvedilol (COREG) tablet 6.25 mg (has no administration in time range)  enoxaparin (LOVENOX) injection 30 mg (has no administration in time range)  acetaminophen (TYLENOL) tablet 650 mg (has no administration in time range)    Or  acetaminophen (TYLENOL) suppository 650 mg (has no administration in time range)  polyethylene glycol (MIRALAX / GLYCOLAX) packet 17 g (has no administration in time range)    ED Course  I have reviewed the triage vital signs and the nursing notes.  Pertinent labs & imaging results that were available during my care of the patient were reviewed by me and considered in my medical decision making (see chart for details).    MDM Rules/Calculators/A&P                          Pt does have a right femoral neck fracture.  Pt given fentanyl for pain.   I spoke with pt's daughter Annett Fabian(Jean Jackson).  She said pt is normally ambulatory.  She is not sure yet if she does want her mother to have hip surgery.  Due to this, I have not called ortho.    [facility], so I spoke with [facility] for admission.  Pt's daughter did call back later and is interested in surgery.  I spoke with Dr. Carola FrostHandy (ortho) who will see pt in the morning.  Final Clinical Impression(s) / ED Diagnoses Final diagnoses:  Fall, initial encounter  Closed fracture of neck of right femur, initial encounter [facility](HCC)    Rx / DC Orders ED Discharge Orders    None       Jacalyn LefevreHaviland, Julie, MD 08/10/20 Lenoria [id number]    Jacalyn LefevreHaviland, Julie, MD 08/10/20 [id number]

## 2020-08-10 ENCOUNTER — Emergency Department (HOSPITAL_COMMUNITY): Payer: Medicare (Managed Care)

## 2020-08-10 DIAGNOSIS — S72009A Fracture of unspecified part of neck of unspecified femur, initial encounter for closed fracture: Secondary | ICD-10-CM | POA: Diagnosis present

## 2020-08-10 DIAGNOSIS — Z888 Allergy status to other drugs, medicaments and biological substances status: Secondary | ICD-10-CM | POA: Diagnosis not present

## 2020-08-10 DIAGNOSIS — W06XXXA Fall from bed, initial encounter: Secondary | ICD-10-CM | POA: Diagnosis present

## 2020-08-10 DIAGNOSIS — Y92003 Bedroom of unspecified non-institutional (private) residence as the place of occurrence of the external cause: Secondary | ICD-10-CM | POA: Diagnosis not present

## 2020-08-10 DIAGNOSIS — G301 Alzheimer's disease with late onset: Secondary | ICD-10-CM | POA: Diagnosis present

## 2020-08-10 DIAGNOSIS — D62 Acute posthemorrhagic anemia: Secondary | ICD-10-CM | POA: Diagnosis not present

## 2020-08-10 DIAGNOSIS — N179 Acute kidney failure, unspecified: Secondary | ICD-10-CM | POA: Diagnosis present

## 2020-08-10 DIAGNOSIS — G309 Alzheimer's disease, unspecified: Secondary | ICD-10-CM

## 2020-08-10 DIAGNOSIS — S72011A Unspecified intracapsular fracture of right femur, initial encounter for closed fracture: Secondary | ICD-10-CM | POA: Diagnosis present

## 2020-08-10 DIAGNOSIS — E871 Hypo-osmolality and hyponatremia: Secondary | ICD-10-CM | POA: Diagnosis present

## 2020-08-10 DIAGNOSIS — E785 Hyperlipidemia, unspecified: Secondary | ICD-10-CM | POA: Diagnosis present

## 2020-08-10 DIAGNOSIS — S72001A Fracture of unspecified part of neck of right femur, initial encounter for closed fracture: Secondary | ICD-10-CM | POA: Diagnosis not present

## 2020-08-10 DIAGNOSIS — Z20822 Contact with and (suspected) exposure to covid-19: Secondary | ICD-10-CM | POA: Diagnosis present

## 2020-08-10 DIAGNOSIS — R296 Repeated falls: Secondary | ICD-10-CM | POA: Diagnosis present

## 2020-08-10 DIAGNOSIS — R8271 Bacteriuria: Secondary | ICD-10-CM | POA: Diagnosis present

## 2020-08-10 DIAGNOSIS — I129 Hypertensive chronic kidney disease with stage 1 through stage 4 chronic kidney disease, or unspecified chronic kidney disease: Secondary | ICD-10-CM | POA: Diagnosis present

## 2020-08-10 DIAGNOSIS — I10 Essential (primary) hypertension: Secondary | ICD-10-CM | POA: Diagnosis not present

## 2020-08-10 DIAGNOSIS — N1832 Chronic kidney disease, stage 3b: Secondary | ICD-10-CM | POA: Diagnosis present

## 2020-08-10 DIAGNOSIS — F0280 Dementia in other diseases classified elsewhere without behavioral disturbance: Secondary | ICD-10-CM | POA: Diagnosis present

## 2020-08-10 DIAGNOSIS — E559 Vitamin D deficiency, unspecified: Secondary | ICD-10-CM | POA: Diagnosis present

## 2020-08-10 DIAGNOSIS — Z8616 Personal history of COVID-19: Secondary | ICD-10-CM | POA: Diagnosis not present

## 2020-08-10 DIAGNOSIS — Z880 Allergy status to penicillin: Secondary | ICD-10-CM | POA: Diagnosis not present

## 2020-08-10 DIAGNOSIS — M810 Age-related osteoporosis without current pathological fracture: Secondary | ICD-10-CM | POA: Diagnosis present

## 2020-08-10 DIAGNOSIS — Z66 Do not resuscitate: Secondary | ICD-10-CM | POA: Diagnosis present

## 2020-08-10 LAB — URINALYSIS, ROUTINE W REFLEX MICROSCOPIC
Bilirubin Urine: NEGATIVE
Glucose, UA: NEGATIVE mg/dL
Ketones, ur: NEGATIVE mg/dL
Leukocytes,Ua: NEGATIVE
Nitrite: POSITIVE — AB
Protein, ur: NEGATIVE mg/dL
Specific Gravity, Urine: 1.012 (ref 1.005–1.030)
pH: 6 (ref 5.0–8.0)

## 2020-08-10 LAB — CBC WITH DIFFERENTIAL/PLATELET
Abs Immature Granulocytes: 0.02 10*3/uL (ref 0.00–0.07)
Basophils Absolute: 0 10*3/uL (ref 0.0–0.1)
Basophils Relative: 0 %
Eosinophils Absolute: 0.1 10*3/uL (ref 0.0–0.5)
Eosinophils Relative: 1 %
HCT: 38 % (ref 36.0–46.0)
Hemoglobin: 11.6 g/dL — ABNORMAL LOW (ref 12.0–15.0)
Immature Granulocytes: 0 %
Lymphocytes Relative: 27 %
Lymphs Abs: 1.3 10*3/uL (ref 0.7–4.0)
MCH: 28.8 pg (ref 26.0–34.0)
MCHC: 30.5 g/dL (ref 30.0–36.0)
MCV: 94.3 fL (ref 80.0–100.0)
Monocytes Absolute: 0.2 10*3/uL (ref 0.1–1.0)
Monocytes Relative: 5 %
Neutro Abs: 3.1 10*3/uL (ref 1.7–7.7)
Neutrophils Relative %: 67 %
Platelets: 173 10*3/uL (ref 150–400)
RBC: 4.03 MIL/uL (ref 3.87–5.11)
RDW: 14.1 % (ref 11.5–15.5)
WBC: 4.7 10*3/uL (ref 4.0–10.5)
nRBC: 0 % (ref 0.0–0.2)

## 2020-08-10 LAB — CBC
HCT: 36.7 % (ref 36.0–46.0)
Hemoglobin: 11.8 g/dL — ABNORMAL LOW (ref 12.0–15.0)
MCH: 29.7 pg (ref 26.0–34.0)
MCHC: 32.2 g/dL (ref 30.0–36.0)
MCV: 92.4 fL (ref 80.0–100.0)
Platelets: 147 10*3/uL — ABNORMAL LOW (ref 150–400)
RBC: 3.97 MIL/uL (ref 3.87–5.11)
RDW: 14.2 % (ref 11.5–15.5)
WBC: 8.3 10*3/uL (ref 4.0–10.5)
nRBC: 0 % (ref 0.0–0.2)

## 2020-08-10 LAB — BASIC METABOLIC PANEL
Anion gap: 8 (ref 5–15)
Anion gap: 10 (ref 5–15)
BUN: 32 mg/dL — ABNORMAL HIGH (ref 8–23)
BUN: 27 mg/dL — ABNORMAL HIGH (ref 8–23)
CO2: 20 mmol/L — ABNORMAL LOW (ref 22–32)
CO2: 20 mmol/L — ABNORMAL LOW (ref 22–32)
Calcium: 8.8 mg/dL — ABNORMAL LOW (ref 8.9–10.3)
Calcium: 8.9 mg/dL (ref 8.9–10.3)
Chloride: 103 mmol/L (ref 98–111)
Chloride: 105 mmol/L (ref 98–111)
Creatinine, Ser: 1.25 mg/dL — ABNORMAL HIGH (ref 0.44–1.00)
Creatinine, Ser: 1.08 mg/dL — ABNORMAL HIGH (ref 0.44–1.00)
GFR, Estimated: 38 mL/min — ABNORMAL LOW (ref >60)
GFR, Estimated: 45 mL/min — ABNORMAL LOW (ref >60)
Glucose, Bld: 93 mg/dL (ref 70–99)
Glucose, Bld: 103 mg/dL — ABNORMAL HIGH (ref 70–99)
Potassium: 5 mmol/L (ref 3.5–5.1)
Potassium: 4.8 mmol/L (ref 3.5–5.1)
Sodium: 131 mmol/L — ABNORMAL LOW (ref 135–145)
Sodium: 135 mmol/L (ref 135–145)

## 2020-08-10 LAB — VITAMIN D 25 HYDROXY (VIT D DEFICIENCY, FRACTURES): Vit D, 25-Hydroxy: 12.26 ng/mL — ABNORMAL LOW (ref 30–100)

## 2020-08-10 LAB — SODIUM, URINE, RANDOM: Sodium, Ur: 109 mmol/L

## 2020-08-10 LAB — RESP PANEL BY RT-PCR (FLU A&B, COVID) ARPGX2
Influenza A by PCR: NEGATIVE
Influenza B by PCR: NEGATIVE
SARS Coronavirus 2 by RT PCR: NEGATIVE

## 2020-08-10 LAB — TYPE AND SCREEN
ABO/RH(D): A POS
Antibody Screen: NEGATIVE

## 2020-08-10 LAB — OSMOLALITY, URINE: Osmolality, Ur: 501 mOsm/kg (ref 300–900)

## 2020-08-10 LAB — PROTIME-INR
INR: 1.1 (ref 0.8–1.2)
Prothrombin Time: 13.6 seconds (ref 11.4–15.2)

## 2020-08-10 LAB — OSMOLALITY: Osmolality: 299 mOsm/kg — ABNORMAL HIGH (ref 275–295)

## 2020-08-10 MED ORDER — MIRTAZAPINE 15 MG PO TABS
7.5000 mg | ORAL_TABLET | Freq: Every day | ORAL | Status: DC
  Administered 2020-08-10 – 2020-08-14 (×4): 7.5 mg via ORAL
  Filled 2020-08-10 (×6): qty 1

## 2020-08-10 MED ORDER — ACETAMINOPHEN 325 MG PO TABS
650.0000 mg | ORAL_TABLET | Freq: Four times a day (QID) | ORAL | Status: DC
  Administered 2020-08-10: 650 mg via ORAL
  Filled 2020-08-10 (×2): qty 2

## 2020-08-10 MED ORDER — ACETAMINOPHEN 650 MG RE SUPP: 650.0000 mg | Freq: Four times a day (QID) | RECTAL | Status: DC | PRN

## 2020-08-10 MED ORDER — POLYETHYLENE GLYCOL 3350 17 G PO PACK
17.0000 g | PACK | Freq: Every day | ORAL | Status: DC | PRN
  Administered 2020-08-15: 17 g via ORAL
  Filled 2020-08-10: qty 1

## 2020-08-10 MED ORDER — POVIDONE-IODINE 10 % EX SWAB
2.0000 "application " | Freq: Once | CUTANEOUS | Status: AC
  Administered 2020-08-11: 2 via TOPICAL

## 2020-08-10 MED ORDER — ACETAMINOPHEN 325 MG PO TABS
650.0000 mg | ORAL_TABLET | Freq: Four times a day (QID) | ORAL | Status: DC | PRN
Start: 2020-08-10 — End: 2020-08-10

## 2020-08-10 MED ORDER — SENNOSIDES-DOCUSATE SODIUM 8.6-50 MG PO TABS: 2.0000 | ORAL_TABLET | ORAL | Status: DC | PRN

## 2020-08-10 MED ORDER — TIMOLOL MALEATE 0.5 % OP SOLN
1.0000 [drp] | Freq: Every day | OPHTHALMIC | Status: DC
  Administered 2020-08-10 – 2020-08-15 (×5): 1 [drp] via OPHTHALMIC
  Filled 2020-08-10 (×2): qty 5

## 2020-08-10 MED ORDER — LATANOPROST 0.005 % OP SOLN
1.0000 [drp] | Freq: Every day | OPHTHALMIC | Status: DC
  Administered 2020-08-12 – 2020-08-14 (×3): 1 [drp] via OPHTHALMIC
  Filled 2020-08-10: qty 2.5

## 2020-08-10 MED ORDER — SODIUM CHLORIDE 0.9 % IV SOLN: INTRAVENOUS | Status: DC

## 2020-08-10 MED ORDER — ENOXAPARIN SODIUM 30 MG/0.3ML ~~LOC~~ SOLN
30.0000 mg | SUBCUTANEOUS | Status: DC
  Administered 2020-08-10: 30 mg via SUBCUTANEOUS
  Filled 2020-08-10: qty 0.3

## 2020-08-10 MED ORDER — ACETAMINOPHEN 650 MG RE SUPP
650.0000 mg | Freq: Four times a day (QID) | RECTAL | Status: DC
  Administered 2020-08-10: 650 mg via RECTAL
  Filled 2020-08-10: qty 1

## 2020-08-10 MED ORDER — HYDROMORPHONE HCL 1 MG/ML IJ SOLN: 0.2000 mg | Freq: Every day | INTRAMUSCULAR | Status: DC | PRN

## 2020-08-10 MED ORDER — SODIUM CHLORIDE 0.9 % IV SOLN: INTRAVENOUS | Status: AC

## 2020-08-10 MED ORDER — CHLORHEXIDINE GLUCONATE 4 % EX LIQD
60.0000 mL | Freq: Once | CUTANEOUS | Status: AC
  Administered 2020-08-11: 4 via TOPICAL

## 2020-08-10 MED ORDER — SODIUM CHLORIDE 0.9 % IV BOLUS
1000.0000 mL | Freq: Once | INTRAVENOUS | Status: AC
  Administered 2020-08-10: 1000 mL via INTRAVENOUS

## 2020-08-10 MED ORDER — CARVEDILOL 6.25 MG PO TABS
6.2500 mg | ORAL_TABLET | Freq: Two times a day (BID) | ORAL | Status: DC
  Administered 2020-08-12 – 2020-08-15 (×7): 6.25 mg via ORAL
  Filled 2020-08-10 (×5): qty 1
  Filled 2020-08-10: qty 2
  Filled 2020-08-10 (×2): qty 1

## 2020-08-10 MED ORDER — VANCOMYCIN HCL 1000 MG/200ML IV SOLN
1000.0000 mg | INTRAVENOUS | Status: DC
  Filled 2020-08-10: qty 200

## 2020-08-10 MED ORDER — LORATADINE 10 MG PO TABS
10.0000 mg | ORAL_TABLET | Freq: Every day | ORAL | Status: DC
  Administered 2020-08-10 – 2020-08-15 (×5): 10 mg via ORAL
  Filled 2020-08-10 (×5): qty 1

## 2020-08-10 MED ORDER — ENSURE PRE-SURGERY PO LIQD
296.0000 mL | Freq: Once | ORAL | Status: DC
  Filled 2020-08-10: qty 296

## 2020-08-10 MED ORDER — OXYCODONE HCL 5 MG PO TABS: 2.5000 mg | ORAL_TABLET | Freq: Once | ORAL | Status: DC | PRN

## 2020-08-10 MED ORDER — CARVEDILOL 3.125 MG PO TABS: 6.2500 mg | ORAL_TABLET | Freq: Every day | ORAL | Status: DC

## 2020-08-10 MED ORDER — HYDROMORPHONE HCL 1 MG/ML IJ SOLN: 0.2000 mg | INTRAMUSCULAR | Status: DC | PRN

## 2020-08-10 NOTE — Progress Notes (Addendum)
Subjective:  Overnight, no acute events.  This morning, patient not verbally responsive upon evaluation, however patient's daughter is at bedside. Patient's daughter reports that they have not yet seen the orthopedic surgery team, however she is hopeful to be able to proceed with surgical intervention for her mother's right transcervical femoral neck fracture as soon as possible. Patient's daughter has no further questions at this time.  Objective:  Vital signs in last 24 hours: Vitals:   08/10/20 0451 08/10/20 0500 08/10/20 0715 08/10/20 0812  BP: (!) 161/94 (!) 153/80 (!) 159/78 (!) 161/89  Pulse: (!) 108 (!) 104 96 96  Resp:  (!) 25 12 14   Temp:    98.4 F (36.9 C)  TempSrc:    Axillary  SpO2: 100% 97% 100% 96%   Physical Exam Vitals reviewed.  Constitutional:      Appearance: She is ill-appearing.     Comments: Thin, frail, elderly woman laying in hospital bed with eyes partially closed quietly groaning to movement of her bilateral lower extremities.  Cardiovascular:     Rate and Rhythm: Regular rhythm. Tachycardia present.     Heart sounds: Murmur heard.      Comments: Systolic murmur best heard over RUS border Pulmonary:     Effort: Pulmonary effort is normal. No respiratory distress.  Musculoskeletal:     Comments: Flexed and externally rotated right hip. Groaning upon passive movement of lower extremities  Skin:    Findings: No bruising, erythema or lesion.     Comments: Decreased skin turgor   Labs in last 24 hours: CBC Latest Ref Rng & Units 08/09/2020 06/21/2014 10/14/2009  WBC 4.0 - 10.5 K/uL 4.7 7.5 -  Hemoglobin 12.0 - 15.0 g/dL 11.6(L) 12.5 11.9(L)  Hematocrit 36.0 - 46.0 % 38.0 37.9 35.0(L)  Platelets 150 - 400 K/uL 173 166 -   BMP Latest Ref Rng & Units 08/09/2020 06/21/2014 10/14/2009  Glucose 70 - 99 mg/dL 93 92 12/14/2009)  BUN 8 - 23 mg/dL 938(H) 16 21  Creatinine 0.44 - 1.00 mg/dL 82(X) 9.37(J) 6.96(V)  Sodium 135 - 145 mmol/L 131(L) 138 139  Potassium 3.5  - 5.1 mmol/L 5.0 3.3(L) 3.7  Chloride 98 - 111 mmol/L 103 103 105  CO2 22 - 32 mmol/L 20(L) 27 -  Calcium 8.9 - 10.3 mg/dL 8.9(F) 9.1 -   INR - 1.1 COVID - negative Urinalysis - positive nitrites, many bacteria, negative WBCs/leukocytes/squamous epithelial cells Serum osmolality - 299 Urine osmolality - 501 Urine sodium - 109 25OH Vitamin D - needs collection  Imaging in last 24 hours: DG Chest 2 View IMPRESSION: No active cardiopulmonary disease.  CT Head Wo Contrast Result Date: 08/10/2020 IMPRESSION: No acute intracranial abnormality. Findings consistent with age related atrophy and chronic small vessel ischemia No acute fracture or malalignment of the spine.  CT Cervical Spine Wo Contrast Result Date: 08/10/2020 IMPRESSION: No acute intracranial abnormality. Findings consistent with age related atrophy and chronic small vessel ischemia No acute fracture or malalignment of the spine.  DG Hip Unilat W or Wo Pelvis 2-3 Views Right IMPRESSION: Slightly impacted mildly displaced transcervical femoral neck fracture.  Assessment/Plan:  Principal Problem:   Hip fracture (HCC) Active Problems:   Essential hypertension   Alzheimer disease (HCC)   Asymptomatic bacteriuria  Nelani Schmelzle is a 87 year old woman living with late onset alzheimer's dementia and aortic valve stenosis who presented to [facility] on 08/09/2020 from [facility] after an unwitnessed fall found to have a right femoral neck fracture admitted for  surgical management.  #Right transcervical femoral neck fracture, active Orthopedic surgery has been consulted for surgical management of patient's fracture, currently awaiting formal evaluation and recommendations. While awaiting operative management, will continue with pain control with scheduled acetaminophen and IV dilaudid Q3H PRN. -Orthopedic surgery consulted, appreciate recommendations -Continue scheduled acetaminophen 650mg  Q6H -Continue IV dilaudid Q3H  PRN  #Hypertension, chronic Patient's blood pressure elevated to 150s-160s systolic in the setting of her acute pain. -Continue home regimen of carvedilol 6.25mg  twice daily  #Asymptomatic bacteriuria, active Patient noted to have urinalysis with positive nitrites and many bacteria without leukocyte esterase or WBCs. -Continue to monitor   #Diet: NPO #IVF: NS 125cc/hr x10 hours #VTE ppx: Enoxaparin 30mg  daily #Code status: DNR #Bowel regimen: Miralax 17g PRN and senna two tablets PRN  , MD 08/10/2020, 8:31 AM Pager: [phone] After 5pm on weekdays and 1pm on weekends: On Call pager [phone]

## 2020-08-10 NOTE — H&P (Addendum)
Date: 08/10/2020               Patient Name:  [name] MRN: [id number]  DOB: 09/09/1915 Age / Sex: 87 y.o., female   PCP: Renaye Rakers, MD         Medical Service: Internal Medicine Teaching Service         Attending Physician: Dr. Oswaldo Done, Marquita Palms, *    First Contact: Dr. Laural Benes Pager: [phone]   Second Contact: Dr. Barbaraann Faster Pager: [phone]       After Hours (After 5p/  First Contact Pager: [phone]  weekends / holidays): Second Contact Pager: [phone]   Chief Complaint: right leg pain  History of Present Illness:   [name] is a 87 year old person living with hyperlipidemia, hypertension, late onset alzheimer's dementia, CKD stage III, aortic valve stenosis presents from [facility] after unwitnessed fall. Unable to obtain history from patient due to dementia. Rest of history obtain per chart review.   Called [facility] and [facility] for further information. Patient has been in her usual state of health. Found on the floor of her room unable to stand or mover her right leg. Fall was unwitnessed and patient was found to be awake. At baseline she is disoriented, does not follow commands, able to speak but is incoherent. She is able to walk short distances but is generally weak. Needs assistance to go to the bathroom. Eats a regular diet, able to feed herself. Has been eating and drinking well. No recent falls or changes in behavior noted at facility.  Patient found to have right femur fracture in the ED. ED provider contacted daughter and updated on situation. Family currently in process if determining whether or not to pursue surgical intervention. IMTS called for admission for pain management.  Meds:  Coreg 6.25 ng twice daily Senokot-S 2 tablets as needed Timolol 0.5% 1 drop in right eye daily Loratadine 10 mg daily Mirtazapine 7.5 mg at night Latanoprost 0.005% 1 drop in both eyes ever evening  Tylenol 650 mg every 12 hours as needed  Allergies: Allergies as of  08/09/2020 - Review Complete 06/21/2014  Allergen Reaction Noted  . Penicillins     Past Medical History:  Diagnosis Date  . Dementia (HCC)   . Foot ulcer, left (HCC)    lateral aspect on her ankle  . Hyperlipidemia   . Hypertension    Family History: Unable to obtain   Social History: Patient is a [facility] patient brought from [facility].   Review of Systems: A complete ROS was negative except as per HPI.   Physical Exam: Blood pressure (!) 167/114, pulse 93, resp. rate (!) 26, SpO2 100 %. General: Patient laying in bed, appears somewhat uncomfortable when moving RLE Head: Normocephalic, atraumatic, EOMI, Conjunctivae normal ENT: No congestion, no rhinorrhea Cardiovascular:Normal rate, regular rhythm. Holosystolic murmur best heard at the left sternal border Pulmonary: Tachypnec to 22 in room,  breath sounds normal. No wheezing or crackles Abdominal: soft, nontender, bowel sounds present Musculoskeletal: right hip flexed, no ecchymosis, pain with palpation or movement of the right hip Skin: Warm, dry, no rashes Neuro: Not following commands, speaks incoherently  DG Chest 2 View IMPRESSION: No active cardiopulmonary disease. Electronically Signed   By: Jonna Clark M.D.   On: 08/10/2020 00:14   CT Head Wo Contrast IMPRESSION: No acute intracranial abnormality. Findings consistent with age related atrophy and chronic small vessel ischemia No acute fracture or malalignment of the spine. Electronically Signed  By: Jonna Clark M.D.   On: 08/10/2020 00:23   CT Cervical Spine Wo Contrast  Result Date: 08/10/2020 IMPRESSION: No acute intracranial abnormality. Findings consistent with age related atrophy and chronic small vessel ischemia No acute fracture or malalignment of the spine. Electronically Signed   By: Jonna Clark M.D.   On: 08/10/2020 00:23   DG Hip Unilat W or Wo Pelvis 2-3 Views Right  Result Date: 08/10/2020 IMPRESSION: Slightly impacted mildly displaced transcervical  femoral neck fracture. Electronically Signed   By: Jonna Clark M.D.   On: 08/10/2020 00:16     Assessment & Plan by Problem: Principal Problem:   Hip fracture (HCC) Active Problems:   Alzheimer disease (HCC)  [name] is a 87 year old person living with hyperlipidemia, hypertension, late onset alzheimer's dementia, CKD stage III, aortic valve stenosis presents from [facility] after unwitnessed fall found to have a right femur fracture admitted for pain management.   Right femur fracture Patient found on floor of her room this evening after unwitnessed fall. Found having pain in the right leg and brought to the ED from nursing facility. Behaving at her baseline on exam. Pain in on palpation of the right hip and with any movement. Unclear what was the cause of her fall. Was awake when found, no signs of bowel or bladder incontinence. Xray with mildly displace transcervical femoral neck fracture. CT head and spine without acute intracranial process, no acute fracture. Family would like to proceed to surgical repair. ED provider discussed with orthopedic surgery.  - Ortho consulted, appreciate recommendations  - Tylenol 650 every scheduled every 6 hours, oxycodone 2.5 mg as needed - Senakot-s, miralax  Mild Hyponatremia Patient appears to be at baseline. Na of 131 on admission. Maybe hypovolemic hypotension in setting of poor PO intake. - Osmolality, urine Osmolality, Urine Na - AM BMP  Hypertension BP of 162/114, on Coreg 6.25 twice daily at home - continue coreg  CKD Stage IIIb BUN of 32,  Cr. Of 1.25. Unclear baseline. Last Cr of 1.23 from 6 years ago - monitor BMP  Alzheimer's Dementia Not on medication for this. Live at [facility]. Appears at her baseline per facility.  -delirium precautions  Diet: NPO Fluids: NS 125 for 10 hours VTE; Lovenox Code: DNR  Dispo: Admit patient to Inpatient with expected length of stay greater than 2 midnights.  Signed: Quincy Simmonds,  MD 08/10/2020, 1:44 AM  Pager: [phone] After 5pm on weekdays and 1pm on weekends: On Call pager: [phone]

## 2020-08-10 NOTE — TOC CAGE-AID Note (Signed)
Transition of Care [facility]) - CAGE-AID Screening   Patient Details  Name: [name] MRN: [id number] Date of Birth: [birth year]     Hewitt Shorts, RN Phone Number: 08/10/2020, 6:34 PM   Clinical Narrative:    Pt denies any alcohol/drug use   CAGE-AID Screening:    Have You Ever Felt You Ought to Cut Down on Your Drinking or Drug Use?: No Have People Annoyed You By Critizing Your Drinking Or Drug Use?: No Have You Felt Bad Or Guilty About Your Drinking Or Drug Use?: No Have You Ever Had a Drink or Used Drugs First Thing In The Morning to Steady Your Nerves or to Get Rid of a Hangover?: No CAGE-AID Score: 0  Substance Abuse Education Offered: No

## 2020-08-10 NOTE — Plan of Care (Signed)

## 2020-08-10 NOTE — Consult Note (Signed)
Reason for Consult:Right hip fx Referring Physician: Jearld AdjutantD Vincent Time called: 1610: 0834 Time at bedside: 0858   [name] is an [age] y.o. female.  HPI: [name] was at the [facility] when she fell around midnight last night. She had immediate right hip pain and could not get up. She was brought to the ED where x-rays showed a right hip fx and orthopedic surgery was consulted. She is pleasantly demented and cannot contribute to history.  Past Medical History:  Diagnosis Date  . Dementia (HCC)   . Foot ulcer, left (HCC)    lateral aspect on her ankle  . Hyperlipidemia   . Hypertension     No past surgical history on file.  No family history on file.  Social History:  reports that she has never smoked. She does not have any smokeless tobacco history on file. She reports that she does not drink alcohol and does not use drugs.  Allergies:  Allergies  Allergen Reactions  . Aricept [Donepezil]   . Namenda [Memantine]   . Penicillin G     Other reaction(s): Unknown  . Penicillins     REACTION: unknown    Medications: I have reviewed the patient's current medications.  Results for orders placed or performed during the hospital encounter of 08/09/20 (from the past 48 hour(s))  Basic metabolic panel     Status: Abnormal   Collection Time: 08/09/20 11:28 PM  Result Value Ref Range   Sodium 131 (L) 135 - 145 mmol/L   Potassium 5.0 3.5 - 5.1 mmol/L   Chloride 103 98 - 111 mmol/L   CO2 20 (L) 22 - 32 mmol/L   Glucose, Bld 93 70 - 99 mg/dL    Comment: Glucose reference range applies only to samples taken after fasting for at least 8 hours.   BUN 32 (H) 8 - 23 mg/dL   Creatinine, Ser 9.601.25 (H) 0.44 - 1.00 mg/dL   Calcium 8.8 (L) 8.9 - 10.3 mg/dL   GFR, Estimated 38 (L) >60 mL/min    Comment: (NOTE) Calculated using the CKD-EPI Creatinine Equation (2021)    Anion gap 8 5 - 15    Comment: Performed at [facility], [address] [place], [place] [address]  CBC with  Differential     Status: Abnormal   Collection Time: 08/09/20 11:28 PM  Result Value Ref Range   WBC 4.7 4.0 - 10.5 K/uL   RBC 4.03 3.87 - 5.11 MIL/uL   Hemoglobin 11.6 (L) 12.0 - 15.0 g/dL   HCT 81.138.0 91.436.0 - 78.246.0 %   MCV 94.3 80.0 - 100.0 fL   MCH 28.8 26.0 - 34.0 pg   MCHC 30.5 30.0 - 36.0 g/dL   RDW 95.614.1 21.311.5 - 08.615.5 %   Platelets 173 150 - 400 K/uL   nRBC 0.0 0.0 - 0.2 %   Neutrophils Relative % 67 %   Neutro Abs 3.1 1.7 - 7.7 K/uL   Lymphocytes Relative 27 %   Lymphs Abs 1.3 0.7 - 4.0 K/uL   Monocytes Relative 5 %   Monocytes Absolute 0.2 0.1 - 1.0 K/uL   Eosinophils Relative 1 %   Eosinophils Absolute 0.1 0.0 - 0.5 K/uL   Basophils Relative 0 %   Basophils Absolute 0.0 0.0 - 0.1 K/uL   Immature Granulocytes 0 %   Abs Immature Granulocytes 0.02 0.00 - 0.07 K/uL    Comment: Performed at [facility], [address] [place], [place] [address]  Protime-INR     Status: None   Collection Time: 08/10/20 12:33 AM  Result Value Ref Range   Prothrombin Time 13.6 11.4 - 15.2 seconds   INR 1.1 0.8 - 1.2    Comment: (NOTE) INR goal varies based on device and disease states. Performed at [facility], [address], [place], [place] [address]   Resp Panel by RT-PCR (Flu A&B, Covid) Nasopharyngeal Swab     Status: None   Collection Time: 08/10/20 12:38 AM   Specimen: Nasopharyngeal Swab; Nasopharyngeal(NP) swabs in vial transport medium  Result Value Ref Range   SARS Coronavirus 2 by RT PCR NEGATIVE NEGATIVE    Comment: (NOTE) SARS-CoV-2 target nucleic acids are NOT DETECTED.  The SARS-CoV-2 RNA is generally detectable in upper respiratory specimens during the acute phase of infection. The lowest concentration of SARS-CoV-2 viral copies this assay can detect is 138 copies/mL. A negative result does not preclude SARS-Cov-2 infection and should not be used as the sole basis for treatment or other patient management decisions. A negative result may occur with   improper specimen collection/handling, submission of specimen other than nasopharyngeal swab, presence of viral mutation(s) within the areas targeted by this assay, and inadequate number of viral copies(<138 copies/mL). A negative result must be combined with clinical observations, patient history, and epidemiological information. The expected result is Negative.  Fact Sheet for Patients:  BloggerCourse.com  Fact Sheet for Healthcare Providers:  SeriousBroker.it  This test is no t yet approved or cleared by the [place] FDA and  has been authorized for detection and/or diagnosis of SARS-CoV-2 by FDA under an Emergency Use Authorization (EUA). This EUA will remain  in effect (meaning this test can be used) for the duration of the COVID-19 declaration under Section 564(b)(1) of the Act, 21 U.S.C.section 360bbb-3(b)(1), unless the authorization is terminated  or revoked sooner.       Influenza A by PCR NEGATIVE NEGATIVE   Influenza B by PCR NEGATIVE NEGATIVE    Comment: (NOTE) The Xpert Xpress SARS-CoV-2/FLU/RSV plus assay is intended as an aid in the diagnosis of influenza from Nasopharyngeal swab specimens and should not be used as a sole basis for treatment. Nasal washings and aspirates are unacceptable for Xpert Xpress SARS-CoV-2/FLU/RSV testing.  Fact Sheet for Patients: BloggerCourse.com  Fact Sheet for Healthcare Providers: SeriousBroker.it  This test is not yet approved or cleared by the [place] FDA and has been authorized for detection and/or diagnosis of SARS-CoV-2 by FDA under an Emergency Use Authorization (EUA). This EUA will remain in effect (meaning this test can be used) for the duration of the COVID-19 declaration under Section 564(b)(1) of the Act, 21 U.S.C. section 360bbb-3(b)(1), unless the authorization is terminated or revoked.  Performed at  [facility], [address] [place], [place] [address]   Type and screen [facility]     Status: None   Collection Time: 08/10/20  1:45 AM  Result Value Ref Range   ABO/RH(D) A POS    Antibody Screen NEG    Sample Expiration      08/13/2020,2359 Performed at [facility], [address], [place], [place] [address]   Osmolality     Status: Abnormal   Collection Time: 08/10/20  4:55 AM  Result Value Ref Range   Osmolality 299 (H) 275 - 295 mOsm/kg    Comment: Performed at [facility], [address], [place], [place] [address]  Urinalysis, Routine w reflex  microscopic Urine, Clean Catch     Status: Abnormal   Collection Time: 08/10/20  5:19 AM  Result Value Ref Range   Color, Urine YELLOW YELLOW   APPearance HAZY (A) CLEAR   Specific Gravity, Urine 1.012 1.005 - 1.030   pH 6.0 5.0 - 8.0   Glucose, UA NEGATIVE NEGATIVE mg/dL   Hgb urine dipstick SMALL (A) NEGATIVE   Bilirubin Urine NEGATIVE NEGATIVE   Ketones, ur NEGATIVE NEGATIVE mg/dL   Protein, ur NEGATIVE NEGATIVE mg/dL   Nitrite POSITIVE (A) NEGATIVE   Leukocytes,Ua NEGATIVE NEGATIVE   RBC / HPF 0-5 0 - 5 RBC/hpf   WBC, UA 0-5 0 - 5 WBC/hpf   Bacteria, UA MANY (A) NONE SEEN   Squamous Epithelial / LPF 0-5 0 - 5    Comment: Performed at [facility], [address], [place], [place] [address]  Osmolality, urine     Status: None   Collection Time: 08/10/20  5:19 AM  Result Value Ref Range   Osmolality, Ur 501 300 - 900 mOsm/kg    Comment: Performed at [facility], [address], [place], [place] [address]  Sodium, urine, random     Status: None   Collection Time: 08/10/20  5:19 AM  Result Value Ref Range   Sodium, Ur 109 mmol/L    Comment: Performed at [facility], [address] [place], [place] [address]    DG Chest 2 View  Result Date: 08/10/2020 CLINICAL DATA:  Fall EXAM: CHEST - 2 VIEW COMPARISON:  June 21, 2014 FINDINGS: The heart size and  mediastinal contours are unchanged with moderate cardiomegaly. Aortic knob calcifications are seen. Both lungs are clear. The visualized skeletal structures are unremarkable. IMPRESSION: No active cardiopulmonary disease. Electronically Signed   By: Jonna Clark M.D.   On: 08/10/2020 00:14   CT Head Wo Contrast  Result Date: 08/10/2020 CLINICAL DATA:  Change in mental status EXAM: CT HEAD WITHOUT CONTRAST TECHNIQUE: Contiguous axial images were obtained from the base of the skull through the vertex without intravenous contrast. COMPARISON:  None. FINDINGS: Brain: No evidence of acute territorial infarction, hemorrhage, hydrocephalus,extra-axial collection or mass lesion/mass effect. There is dilatation the ventricles and sulci consistent with age-related atrophy. Low-attenuation changes in the deep white matter consistent with small vessel ischemia. Vascular: No hyperdense vessel or unexpected calcification. Skull: The skull is intact. No fracture or focal lesion identified. Sinuses/Orbits: The visualized paranasal sinuses and mastoid air cells are clear. The orbits and globes intact. Other: None Cervical spine: Alignment: There is straightening of the normal cervical lordosis. Skull base and vertebrae: Visualized skull base is intact. No atlanto-occipital dissociation. The vertebral body heights are well maintained. No fracture or pathologic osseous lesion seen. Soft tissues and spinal canal: The visualized paraspinal soft tissues are unremarkable. No prevertebral soft tissue swelling is seen. The spinal canal is grossly unremarkable, no large epidural collection or significant canal narrowing. Disc levels: Multilevel cervical spine spondylosis seen with disc osteophyte complex and uncovertebral osteophytes most notable at C3-C4 with moderate neural foraminal narrowing and mild central canal stenosis.6 Upper chest: The lung apices are clear. Thoracic inlet is within normal limits. Other: Carotid artery  calcifications are seen. There is scattered aortic atherosclerosis. IMPRESSION: No acute intracranial abnormality. Findings consistent with age related atrophy and chronic small vessel ischemia No acute fracture or malalignment of the spine. Electronically Signed   By: Jonna Clark M.D.   On: 08/10/2020 00:23   CT Cervical Spine  Wo Contrast  Result Date: 08/10/2020 CLINICAL DATA:  Change in mental status EXAM: CT HEAD WITHOUT CONTRAST TECHNIQUE: Contiguous axial images were obtained from the base of the skull through the vertex without intravenous contrast. COMPARISON:  None. FINDINGS: Brain: No evidence of acute territorial infarction, hemorrhage, hydrocephalus,extra-axial collection or mass lesion/mass effect. There is dilatation the ventricles and sulci consistent with age-related atrophy. Low-attenuation changes in the deep white matter consistent with small vessel ischemia. Vascular: No hyperdense vessel or unexpected calcification. Skull: The skull is intact. No fracture or focal lesion identified. Sinuses/Orbits: The visualized paranasal sinuses and mastoid air cells are clear. The orbits and globes intact. Other: None Cervical spine: Alignment: There is straightening of the normal cervical lordosis. Skull base and vertebrae: Visualized skull base is intact. No atlanto-occipital dissociation. The vertebral body heights are well maintained. No fracture or pathologic osseous lesion seen. Soft tissues and spinal canal: The visualized paraspinal soft tissues are unremarkable. No prevertebral soft tissue swelling is seen. The spinal canal is grossly unremarkable, no large epidural collection or significant canal narrowing. Disc levels: Multilevel cervical spine spondylosis seen with disc osteophyte complex and uncovertebral osteophytes most notable at C3-C4 with moderate neural foraminal narrowing and mild central canal stenosis.6 Upper chest: The lung apices are clear. Thoracic inlet is within normal limits.  Other: Carotid artery calcifications are seen. There is scattered aortic atherosclerosis. IMPRESSION: No acute intracranial abnormality. Findings consistent with age related atrophy and chronic small vessel ischemia No acute fracture or malalignment of the spine. Electronically Signed   By: Jonna Clark M.D.   On: 08/10/2020 00:23   DG Hip Unilat W or Wo Pelvis 2-3 Views Right  Result Date: 08/10/2020 CLINICAL DATA:  Fall EXAM: DG HIP (WITH OR WITHOUT PELVIS) 2-3V RIGHT COMPARISON:  None. FINDINGS: There is a slightly impacted mildly displaced fracture seen through the transcervical right femoral neck. There is slight superior subluxation of the femoral shaft. The femoral head still articulates with the acetabulum. There is diffuse osteopenia. Overlying soft tissue swelling is seen. IMPRESSION: Slightly impacted mildly displaced transcervical femoral neck fracture. Electronically Signed   By: Jonna Clark M.D.   On: 08/10/2020 00:16    Review of Systems  Unable to perform ROS: Mental status change   Blood pressure (!) 161/89, pulse 96, temperature 98.4 F (36.9 C), temperature source Axillary, resp. rate 14, SpO2 96 %. Physical Exam Constitutional:      General: She is not in acute distress.    Appearance: She is well-developed and well-nourished. She is not diaphoretic.  HENT:     Head: Normocephalic and atraumatic.  Eyes:     General: No scleral icterus.       Right eye: No discharge.        Left eye: No discharge.     Conjunctiva/sclera: Conjunctivae normal.  Cardiovascular:     Rate and Rhythm: Normal rate and regular rhythm.  Pulmonary:     Effort: Pulmonary effort is normal. No respiratory distress.  Musculoskeletal:     Cervical back: Normal range of motion.     Comments: LLE No traumatic wounds, ecchymosis, or rash  TTP hip, held flexed and internally rotated  No knee or ankle effusion  Knee stable to varus/ valgus and anterior/posterior stress  Sens DPN, SPN, TN grossly  intact  Motor EHL, ext, flex, evers grossly intact  DP 2+, PT 0, No significant edema  Skin:    General: Skin is warm and dry.  Neurological:  Mental Status: She is alert.  Psychiatric:        Mood and Affect: Mood and affect normal.        Behavior: Behavior normal.     Assessment/Plan: Right hip fx -- Plan hip hemi, time and surgeon TBD but will be tomorrow. Will give diet, NPO after MN. Multiple medical problems including Alzheimer's dementia, CKD stage IIIb, and hypertension -- per primary service    Freeman Caldron, PA-C Orthopedic Surgery [phone] 08/10/2020, 9:08 AM

## 2020-08-10 NOTE — Anesthesia Preprocedure Evaluation (Addendum)
Anesthesia Evaluation  Patient identified by MRN, date of birth, ID band Patient confused    Reviewed: Allergy & Precautions, NPO status , Patient's Chart, lab work & pertinent test results, reviewed documented beta blocker date and time   History of Anesthesia Complications Negative for: history of anesthetic complications  Airway Mallampati: II  TM Distance: >3 FB Neck ROM: Full    Dental  (+) Upper Dentures, Edentulous Lower   Pulmonary neg pulmonary ROS,    Pulmonary exam normal        Cardiovascular hypertension, Pt. on medications and Pt. on home beta blockers  Rhythm:Regular Rate:Normal + Systolic murmurs    Neuro/Psych Dementia negative neurological ROS     GI/Hepatic negative GI ROS, Neg liver ROS,   Endo/Other  negative endocrine ROS  Renal/GU negative Renal ROS  negative genitourinary   Musculoskeletal negative musculoskeletal ROS (+)   Abdominal   Peds  Hematology Hgb 11.8, plt 147   Anesthesia Other Findings Day of surgery medications reviewed with patient.  Reproductive/Obstetrics negative OB ROS                            Anesthesia Physical Anesthesia Plan  ASA: II  Anesthesia Plan: General   Post-op Pain Management:    Induction: Intravenous  PONV Risk Score and Plan: 3 and Treatment may vary due to age or medical condition and Ondansetron  Airway Management Planned: Oral ETT  Additional Equipment:   Intra-op Plan:   Post-operative Plan:   Informed Consent: I have reviewed the patients History and Physical, chart, labs and discussed the procedure including the risks, benefits and alternatives for the proposed anesthesia with the patient or authorized representative who has indicated his/her understanding and acceptance.   Patient has DNR.  Discussed DNR with power of attorney and Continue DNR.   Dental advisory given  Plan Discussed with:  CRNA  Anesthesia Plan Comments: (Patient not able to answer questions due to dementia. DNR status discussed with patient's daughter at bedside. She would like to continue DNR status perioperatively. Stephannie Peters, MD)      Anesthesia Quick Evaluation

## 2020-08-10 NOTE — Plan of Care (Signed)
  Problem: Education: Goal: Knowledge of General Education information will improve Description Including pain rating scale, medication(s)/side effects and non-pharmacologic comfort measures Outcome: Progressing   Problem: Health Behavior/Discharge Planning: Goal: Ability to manage health-related needs will improve Outcome: Progressing   

## 2020-08-11 ENCOUNTER — Inpatient Hospital Stay (HOSPITAL_COMMUNITY): Payer: Medicare (Managed Care) | Admitting: Anesthesiology

## 2020-08-11 ENCOUNTER — Inpatient Hospital Stay (HOSPITAL_COMMUNITY): Payer: Medicare (Managed Care)

## 2020-08-11 ENCOUNTER — Encounter (HOSPITAL_COMMUNITY)
Admission: EM | Disposition: A | Discharge status: Skilled Nursing Facility | Payer: Self-pay | Source: Skilled Nursing Facility | Attending: Student in an Organized Health Care Education/Training Program

## 2020-08-11 ENCOUNTER — Encounter (HOSPITAL_COMMUNITY): Payer: Self-pay | Admitting: Student in an Organized Health Care Education/Training Program

## 2020-08-11 DIAGNOSIS — S72011A Unspecified intracapsular fracture of right femur, initial encounter for closed fracture: Secondary | ICD-10-CM | POA: Diagnosis not present

## 2020-08-11 DIAGNOSIS — D62 Acute posthemorrhagic anemia: Secondary | ICD-10-CM | POA: Diagnosis not present

## 2020-08-11 DIAGNOSIS — Z20822 Contact with and (suspected) exposure to covid-19: Secondary | ICD-10-CM | POA: Diagnosis not present

## 2020-08-11 DIAGNOSIS — N179 Acute kidney failure, unspecified: Secondary | ICD-10-CM | POA: Diagnosis not present

## 2020-08-11 DIAGNOSIS — G309 Alzheimer's disease, unspecified: Secondary | ICD-10-CM | POA: Diagnosis not present

## 2020-08-11 DIAGNOSIS — S72001A Fracture of unspecified part of neck of right femur, initial encounter for closed fracture: Secondary | ICD-10-CM | POA: Diagnosis not present

## 2020-08-11 DIAGNOSIS — E871 Hypo-osmolality and hyponatremia: Secondary | ICD-10-CM | POA: Diagnosis not present

## 2020-08-11 DIAGNOSIS — I10 Essential (primary) hypertension: Secondary | ICD-10-CM | POA: Diagnosis not present

## 2020-08-11 HISTORY — PX: TOTAL HIP ARTHROPLASTY: SHX124

## 2020-08-11 LAB — SURGICAL PCR SCREEN
MRSA, PCR: NEGATIVE
Staphylococcus aureus: NEGATIVE

## 2020-08-11 LAB — CREATININE, SERUM
Creatinine, Ser: 1.03 mg/dL — ABNORMAL HIGH (ref 0.44–1.00)
GFR, Estimated: 48 mL/min — ABNORMAL LOW (ref >60)

## 2020-08-11 SURGERY — ARTHROPLASTY, HIP, TOTAL, ANTERIOR APPROACH
Anesthesia: General | Site: Hip | Laterality: Right

## 2020-08-11 MED ORDER — SODIUM CHLORIDE 0.9 % IV SOLN: INTRAVENOUS | Status: DC | PRN

## 2020-08-11 MED ORDER — VANCOMYCIN HCL IN DEXTROSE 1-5 GM/200ML-% IV SOLN
1000.0000 mg | INTRAVENOUS | Status: AC
  Administered 2020-08-11: 1000 mg via INTRAVENOUS

## 2020-08-11 MED ORDER — MENTHOL 3 MG MT LOZG: 1.0000 | LOZENGE | OROMUCOSAL | Status: DC | PRN

## 2020-08-11 MED ORDER — EPINEPHRINE PF 1 MG/ML IJ SOLN
INTRAMUSCULAR | Status: DC | PRN
  Administered 2020-08-11: .5 mg

## 2020-08-11 MED ORDER — PROPOFOL 10 MG/ML IV BOLUS
INTRAVENOUS | Status: DC | PRN
  Administered 2020-08-11: 30 mg via INTRAVENOUS

## 2020-08-11 MED ORDER — FENTANYL CITRATE (PF) 250 MCG/5ML IJ SOLN
INTRAMUSCULAR | Status: AC
  Filled 2020-08-11: qty 5

## 2020-08-11 MED ORDER — CHLORHEXIDINE GLUCONATE 0.12 % MT SOLN
OROMUCOSAL | Status: AC
  Filled 2020-08-11: qty 15

## 2020-08-11 MED ORDER — FENTANYL CITRATE (PF) 100 MCG/2ML IJ SOLN: 25.0000 ug | INTRAMUSCULAR | Status: DC | PRN

## 2020-08-11 MED ORDER — OXYCODONE HCL 5 MG/5ML PO SOLN: 5.0000 mg | Freq: Once | ORAL | Status: DC | PRN

## 2020-08-11 MED ORDER — ACETAMINOPHEN 325 MG PO TABS
325.0000 mg | ORAL_TABLET | Freq: Four times a day (QID) | ORAL | Status: DC | PRN
  Administered 2020-08-12 – 2020-08-15 (×5): 650 mg via ORAL
  Filled 2020-08-11 (×6): qty 2

## 2020-08-11 MED ORDER — FENTANYL CITRATE (PF) 100 MCG/2ML IJ SOLN
INTRAMUSCULAR | Status: DC | PRN
  Administered 2020-08-11: 50 ug via INTRAVENOUS
  Administered 2020-08-11: 75 ug via INTRAVENOUS

## 2020-08-11 MED ORDER — OXYCODONE HCL 5 MG PO TABS: 5.0000 mg | ORAL_TABLET | Freq: Once | ORAL | Status: DC | PRN

## 2020-08-11 MED ORDER — DOCUSATE SODIUM 100 MG PO CAPS
100.0000 mg | ORAL_CAPSULE | Freq: Two times a day (BID) | ORAL | Status: DC
  Administered 2020-08-12 – 2020-08-14 (×5): 100 mg via ORAL
  Filled 2020-08-11 (×6): qty 1

## 2020-08-11 MED ORDER — VANCOMYCIN HCL 1000 MG/200ML IV SOLN
1000.0000 mg | Freq: Two times a day (BID) | INTRAVENOUS | Status: AC
  Administered 2020-08-11: 1000 mg via INTRAVENOUS
  Filled 2020-08-11: qty 200

## 2020-08-11 MED ORDER — LACTATED RINGERS IV SOLN: INTRAVENOUS | Status: DC | PRN

## 2020-08-11 MED ORDER — MORPHINE SULFATE (PF) 2 MG/ML IV SOLN: 0.5000 mg | INTRAVENOUS | Status: DC | PRN

## 2020-08-11 MED ORDER — BUPIVACAINE HCL (PF) 0.5 % IJ SOLN
INTRAMUSCULAR | Status: AC
  Filled 2020-08-11: qty 30

## 2020-08-11 MED ORDER — EPINEPHRINE PF 1 MG/ML IJ SOLN
INTRAMUSCULAR | Status: AC
  Filled 2020-08-11: qty 1

## 2020-08-11 MED ORDER — PHENYLEPHRINE HCL-NACL 10-0.9 MG/250ML-% IV SOLN
INTRAVENOUS | Status: DC | PRN
  Administered 2020-08-11: 50 ug/min via INTRAVENOUS

## 2020-08-11 MED ORDER — SUGAMMADEX SODIUM 200 MG/2ML IV SOLN
INTRAVENOUS | Status: DC | PRN
  Administered 2020-08-11: 200 mg via INTRAVENOUS

## 2020-08-11 MED ORDER — ALBUMIN HUMAN 5 % IV SOLN
INTRAVENOUS | Status: AC
  Filled 2020-08-11: qty 250

## 2020-08-11 MED ORDER — PROPOFOL 10 MG/ML IV BOLUS
INTRAVENOUS | Status: AC
  Filled 2020-08-11: qty 40

## 2020-08-11 MED ORDER — ETOMIDATE 2 MG/ML IV SOLN
INTRAVENOUS | Status: DC | PRN
  Administered 2020-08-11: 20 mg via INTRAVENOUS

## 2020-08-11 MED ORDER — HYDROCODONE-ACETAMINOPHEN 7.5-325 MG PO TABS
1.0000 | ORAL_TABLET | ORAL | Status: DC | PRN
Start: 2020-08-11 — End: 2020-08-12

## 2020-08-11 MED ORDER — PROMETHAZINE HCL 25 MG/ML IJ SOLN: 6.2500 mg | INTRAMUSCULAR | Status: DC | PRN

## 2020-08-11 MED ORDER — HYDROCODONE-ACETAMINOPHEN 5-325 MG PO TABS: 1.0000 | ORAL_TABLET | ORAL | Status: DC | PRN

## 2020-08-11 MED ORDER — ROCURONIUM BROMIDE 10 MG/ML (PF) SYRINGE
PREFILLED_SYRINGE | INTRAVENOUS | Status: DC | PRN
  Administered 2020-08-11: 40 mg via INTRAVENOUS
  Administered 2020-08-11: 10 mg via INTRAVENOUS

## 2020-08-11 MED ORDER — LIDOCAINE 2% (20 MG/ML) 5 ML SYRINGE
INTRAMUSCULAR | Status: DC | PRN
  Administered 2020-08-11: 60 mg via INTRAVENOUS

## 2020-08-11 MED ORDER — KETOROLAC TROMETHAMINE 30 MG/ML IJ SOLN
INTRAMUSCULAR | Status: DC | PRN
  Administered 2020-08-11: 30 mg via INTRAVENOUS

## 2020-08-11 MED ORDER — PHENOL 1.4 % MT LIQD: 1.0000 | OROMUCOSAL | Status: DC | PRN

## 2020-08-11 MED ORDER — METOCLOPRAMIDE HCL 5 MG PO TABS: 5.0000 mg | ORAL_TABLET | Freq: Three times a day (TID) | ORAL | Status: DC | PRN

## 2020-08-11 MED ORDER — ONDANSETRON HCL 4 MG PO TABS: 4.0000 mg | ORAL_TABLET | Freq: Four times a day (QID) | ORAL | Status: DC | PRN

## 2020-08-11 MED ORDER — SODIUM CHLORIDE 0.9 % IV SOLN: INTRAVENOUS | Status: AC

## 2020-08-11 MED ORDER — KETOROLAC TROMETHAMINE 30 MG/ML IJ SOLN
INTRAMUSCULAR | Status: AC
  Filled 2020-08-11: qty 1

## 2020-08-11 MED ORDER — ONDANSETRON HCL 4 MG/2ML IJ SOLN: 4.0000 mg | Freq: Four times a day (QID) | INTRAMUSCULAR | Status: DC | PRN

## 2020-08-11 MED ORDER — BUPIVACAINE-EPINEPHRINE (PF) 0.5% -1:200000 IJ SOLN
INTRAMUSCULAR | Status: DC | PRN
  Administered 2020-08-11: 30 mL via PERINEURAL

## 2020-08-11 MED ORDER — SODIUM CHLORIDE 0.9 % IR SOLN
Status: DC | PRN
  Administered 2020-08-11: 500 mL

## 2020-08-11 MED ORDER — ALBUMIN HUMAN 5 % IV SOLN
12.5000 g | Freq: Once | INTRAVENOUS | Status: AC
  Administered 2020-08-11: 12.5 g via INTRAVENOUS

## 2020-08-11 MED ORDER — ENOXAPARIN SODIUM 30 MG/0.3ML ~~LOC~~ SOLN
30.0000 mg | SUBCUTANEOUS | Status: DC
  Administered 2020-08-12 – 2020-08-14 (×3): 30 mg via SUBCUTANEOUS
  Filled 2020-08-11 (×3): qty 0.3

## 2020-08-11 MED ORDER — ONDANSETRON HCL 4 MG/2ML IJ SOLN
INTRAMUSCULAR | Status: DC | PRN
  Administered 2020-08-11: 4 mg via INTRAVENOUS

## 2020-08-11 MED ORDER — SENNA 8.6 MG PO TABS
1.0000 | ORAL_TABLET | Freq: Two times a day (BID) | ORAL | Status: DC
  Administered 2020-08-12 – 2020-08-15 (×7): 8.6 mg via ORAL
  Filled 2020-08-11 (×8): qty 1

## 2020-08-11 MED ORDER — METOCLOPRAMIDE HCL 5 MG/ML IJ SOLN: 5.0000 mg | Freq: Three times a day (TID) | INTRAMUSCULAR | Status: DC | PRN

## 2020-08-11 MED ORDER — DEXAMETHASONE SODIUM PHOSPHATE 10 MG/ML IJ SOLN
INTRAMUSCULAR | Status: DC | PRN
  Administered 2020-08-11: 5 mg via INTRAVENOUS

## 2020-08-11 SURGICAL SUPPLY — 67 items
ADH SKN CLS APL DERMABOND .7 (GAUZE/BANDAGES/DRESSINGS) ×2
ALCOHOL 70% 16 OZ (MISCELLANEOUS) ×2 IMPLANT
APL PRP STRL LF DISP 70% ISPRP (MISCELLANEOUS) ×1
BLADE CLIPPER SURG (BLADE) IMPLANT
CHLORAPREP W/TINT 26 (MISCELLANEOUS) ×2 IMPLANT
COVER SURGICAL LIGHT HANDLE (MISCELLANEOUS) ×2 IMPLANT
COVER WAND RF STERILE (DRAPES) ×2 IMPLANT
DERMABOND ADVANCED (GAUZE/BANDAGES/DRESSINGS) ×2
DERMABOND ADVANCED .7 DNX12 (GAUZE/BANDAGES/DRESSINGS) ×2 IMPLANT
DRAPE C-ARM 42X72 X-RAY (DRAPES) ×2 IMPLANT
DRAPE STERI IOBAN 125X83 (DRAPES) ×2 IMPLANT
DRAPE U-SHAPE 47X51 STRL (DRAPES) ×6 IMPLANT
DRSG AQUACEL AG ADV 3.5X10 (GAUZE/BANDAGES/DRESSINGS) ×2 IMPLANT
ELECT BLADE 4.0 EZ CLEAN MEGAD (MISCELLANEOUS) ×2
ELECT PENCIL ROCKER SW 15FT (MISCELLANEOUS) ×2 IMPLANT
ELECT REM PT RETURN 9FT ADLT (ELECTROSURGICAL) ×2
ELECTRODE BLDE 4.0 EZ CLN MEGD (MISCELLANEOUS) ×1 IMPLANT
ELECTRODE REM PT RTRN 9FT ADLT (ELECTROSURGICAL) ×1 IMPLANT
EVACUATOR 1/8 PVC DRAIN (DRAIN) IMPLANT
GLOVE BIO SURGEON STRL SZ8.5 (GLOVE) ×4 IMPLANT
GLOVE BIOGEL M 7.0 STRL (GLOVE) ×2 IMPLANT
GLOVE BIOGEL PI IND STRL 7.5 (GLOVE) ×1 IMPLANT
GLOVE BIOGEL PI IND STRL 8.5 (GLOVE) ×1 IMPLANT
GLOVE BIOGEL PI INDICATOR 7.5 (GLOVE) ×1
GLOVE BIOGEL PI INDICATOR 8.5 (GLOVE) ×1
GOWN STRL REUS W/ TWL LRG LVL3 (GOWN DISPOSABLE) ×2 IMPLANT
GOWN STRL REUS W/ TWL XL LVL3 (GOWN DISPOSABLE) ×1 IMPLANT
GOWN STRL REUS W/TWL 2XL LVL3 (GOWN DISPOSABLE) ×2 IMPLANT
GOWN STRL REUS W/TWL LRG LVL3 (GOWN DISPOSABLE) ×4
GOWN STRL REUS W/TWL XL LVL3 (GOWN DISPOSABLE) ×2
HANDPIECE INTERPULSE COAX TIP (DISPOSABLE) ×2
HEAD FEM UNIPOLAR 44 OD STRL (Hips) ×1 IMPLANT
HOOD PEEL AWAY FACE SHEILD DIS (HOOD) ×4 IMPLANT
JET LAVAGE IRRISEPT WOUND (IRRIGATION / IRRIGATOR) ×2
KIT BASIN OR (CUSTOM PROCEDURE TRAY) ×2 IMPLANT
KIT TURNOVER KIT B (KITS) ×2 IMPLANT
LAVAGE JET IRRISEPT WOUND (IRRIGATION / IRRIGATOR) ×1 IMPLANT
MANIFOLD NEPTUNE II (INSTRUMENTS) ×2 IMPLANT
MARKER SKIN DUAL TIP RULER LAB (MISCELLANEOUS) ×4 IMPLANT
NDL SPNL 18GX3.5 QUINCKE PK (NEEDLE) ×1 IMPLANT
NEEDLE SPNL 18GX3.5 QUINCKE PK (NEEDLE) ×2 IMPLANT
NS IRRIG 1000ML POUR BTL (IV SOLUTION) ×2 IMPLANT
PACK TOTAL JOINT (CUSTOM PROCEDURE TRAY) ×2 IMPLANT
PACK UNIVERSAL I (CUSTOM PROCEDURE TRAY) ×2 IMPLANT
PAD ARMBOARD 7.5X6 YLW CONV (MISCELLANEOUS) ×4 IMPLANT
SAW OSC TIP CART 19.5X105X1.3 (SAW) ×2 IMPLANT
SEALER BIPOLAR AQUA 6.0 (INSTRUMENTS) ×1 IMPLANT
SET HNDPC FAN SPRY TIP SCT (DISPOSABLE) ×1 IMPLANT
SOL PREP POV-IOD 4OZ 10% (MISCELLANEOUS) ×2 IMPLANT
SPACER DEPUY (Hips) ×1 IMPLANT
STEM TRI LOC GRIPTION SZ 7 STD IMPLANT
SUT ETHIBOND NAB CT1 #1 30IN (SUTURE) ×4 IMPLANT
SUT MNCRL AB 3-0 PS2 18 (SUTURE) ×2 IMPLANT
SUT MON AB 2-0 CT1 36 (SUTURE) ×2 IMPLANT
SUT VIC AB 1 CT1 27 (SUTURE) ×2
SUT VIC AB 1 CT1 27XBRD ANBCTR (SUTURE) ×1 IMPLANT
SUT VIC AB 2-0 CT1 27 (SUTURE) ×2
SUT VIC AB 2-0 CT1 TAPERPNT 27 (SUTURE) ×1 IMPLANT
SUT VLOC 180 0 24IN GS25 (SUTURE) ×2 IMPLANT
SYR 50ML LL SCALE MARK (SYRINGE) ×2 IMPLANT
TOWEL GREEN STERILE (TOWEL DISPOSABLE) ×2 IMPLANT
TOWEL GREEN STERILE FF (TOWEL DISPOSABLE) ×2 IMPLANT
TRAY CATH 16FR W/PLASTIC CATH (SET/KITS/TRAYS/PACK) IMPLANT
TRAY FOLEY W/BAG SLVR 16FR (SET/KITS/TRAYS/PACK)
TRAY FOLEY W/BAG SLVR 16FR ST (SET/KITS/TRAYS/PACK) IMPLANT
TRI LOC GRIPTION SZ 7 STD ×2 IMPLANT
WATER STERILE IRR 1000ML POUR (IV SOLUTION) ×6 IMPLANT

## 2020-08-11 NOTE — Progress Notes (Signed)
   Subjective:  Overnight, no acute events.  This afternoon, patient evaluated in PACU following right hip arthroplasty. Patient not responsive to interview and no family at bedside.  Objective:  Vital signs in last 24 hours: Vitals:   08/11/20 1220 08/11/20 1230 08/11/20 1240 08/11/20 1250  BP: (!) 147/63 (!) 150/81 (!) 126/56 (!) 115/53  Pulse: 78 81 74 65  Resp: 12 18 13 13   Temp:      TempSrc:      SpO2: 100% 99% 99% 99%  Weight:      Height:       Physical Exam Vitals reviewed.  Constitutional:      General: She is not in acute distress.    Appearance: She is ill-appearing.     Comments: 87 year old woman supine in bed unresponsive  Cardiovascular:     Rate and Rhythm: Normal rate.     Pulses: Normal pulses.     Heart sounds: Murmur heard.      Comments: 4/6 systolic murmur best heard over RUS border Pulmonary:     Effort: Pulmonary effort is normal. No respiratory distress.  Musculoskeletal:     Comments: Right knee flexed at 15 degrees with pillow behind knee  Neurological:     Mental Status: She is unresponsive.     Comments: Pinpoint pupils bilaterally. Corneal reflex intact    Labs in last 24 hours: 25OH Vitamin D - 12.26  Imaging in last 24 hours: DG Knee Right Port Result Date: 08/11/2020 IMPRESSION: 1. No evidence of acute fracture or dislocation. 2. Moderate knee joint effusion, nonspecific.  DG C-Arm 1-60 Min Result Date: 08/11/2020 IMPRESSION: Right total hip arthroplasty.  DG HIP OPERATIVE UNILAT W OR W/O PELVIS RIGHT Result Date: 08/11/2020 IMPRESSION: Right total hip arthroplasty.   Assessment/Plan:  Principal Problem:   Hip fracture (HCC) Active Problems:   Essential hypertension   Alzheimer disease (HCC)   Asymptomatic bacteriuria  [name] is a 87 year old woman living with late onset alzheimer's dementia and aortic valve stenosis who presented to [facility] on 08/09/2020 from [facility] after an unwitnessed fall found to have a  right femoral neck fracture admitted for surgical management.  #Displaced right femoral neck fracture s/p hemiarthroplasty, active Patient underwent uncomplicated right hip hemiarthroplasty this morning with orthopedic surgery currently recovering in PACU. -Management per orthopedic surgery  #Hypertension, chronic Patient's blood pressure elevated to 170s in setting of acute pain prior to operation. Blood pressure well controlled in PACU -Restart home regimen of carvedilol 6.25mg  twice daily following operation  #Vitamin D deficiency, active 25OH vitamin D noted to be 12.26. -Plan to supplement with 50,000 IU D3 weekly for six to eight weeks  #Diet: NPO #IVF: None #VTE ppx: Enoxaparin 30mg  daily #Code status: DNR #Bowel regimen: Miralax 17g daily PRN and senna two tablets PRN  10/09/2020, MD 08/11/2020, 1:17 PM Pager: [phone] After 5pm on weekdays and 1pm on weekends: On Call pager [phone]

## 2020-08-11 NOTE — Progress Notes (Signed)
08/11/20 1310 Dr. Stephannie Peters with anesthesia called and notified of patients blood pressure SBP 102/50 which is significantly lower than patients' reported baseline. MD also notified that patient remains minimally responsive, will not open eyes or vocalize words. MD to come assess at bedside.  08/11/20 1355 Order received to administer 5% albumin from Dr. Stephannie Peters. Albumin given, blood pressure improved to 136/72. Patient remains minimally responsive, will moan in response to pain but does not open eyes, follow commands, or vocalize words. MD okay for patient to transfer back to floor, MD reports minimal response and not opening eyes prior to surgery.

## 2020-08-11 NOTE — Progress Notes (Signed)
Pt still with minimal response since coming up to the unit from PACU. Surgical team and primary aware of this. Otherwise vital signs are stable, will continue to monitor.

## 2020-08-11 NOTE — Progress Notes (Signed)
Dr. Barbaraann Faster with medical team at bedside to assess patient. MD's reported minimal response from patient at baseline, slight eye opening and incomprehensible speech at baseline. Medical MDs okay for patient to transfer back to floor. Patients vital signs remain stable.

## 2020-08-11 NOTE — Anesthesia Postprocedure Evaluation (Signed)
Anesthesia Post Note  Patient: [name]  Procedure(s) Performed: TOTAL HIP ARTHROPLASTY ANTERIOR APPROACH (Right Hip)     Patient location during evaluation: PACU Anesthesia Type: General Level of consciousness: confused (Eyes closed but mumbles when stimulated, consistent with preop status) Pain management: pain level controlled Vital Signs Assessment: post-procedure vital signs reviewed and stable Respiratory status: spontaneous breathing, nonlabored ventilation and respiratory function stable Cardiovascular status: blood pressure returned to baseline Postop Assessment: no apparent nausea or vomiting Anesthetic complications: no   No complications documented.  Last Vitals:  Vitals:   08/11/20 1350 08/11/20 1400  BP: 136/72 (!) 122/58  Pulse: 81 78  Resp: 14 14  Temp:    SpO2: 98% 99%    Last Pain:  Vitals:   08/11/20 1400  TempSrc:   PainSc: Asleep                 Kaylyn Layer

## 2020-08-11 NOTE — Op Note (Signed)
OPERATIVE REPORT  SURGEON: Samson Frederic, MD   ASSISTANT: Barrie Dunker, PA-C  PREOPERATIVE DIAGNOSIS: Displaced Right femoral neck fracture.   POSTOPERATIVE DIAGNOSIS: Displaced Right femoral neck fracture.   PROCEDURE: Right hip hemiarthroplasty, anterior approach.   IMPLANTS: DePuy Tri Lock stem, size 7, std offset, with a -3 mm spacer and a 44 mm monopolar head ball.  ANESTHESIA:  General  ANTIBIOTICS: 1g Vancomycin  ESTIMATED BLOOD LOSS:-100 mL    DRAINS: None.  COMPLICATIONS: None   CONDITION: PACU - hemodynamically stable.   BRIEF CLINICAL NOTE: [name] is a 87 y.o. female with a displaced Right femoral neck fracture. The patient was admitted to the hospitalist service and underwent perioperative risk stratification and medical optimization. The risks, benefits, and alternatives to hemiarthroplasty were explained, and the patient elected to proceed.  PROCEDURE IN DETAIL: The patient was taken to the operating room and general anesthesia was induced on the hospital bed.  The patient was then positioned on the Hana table.  All bony prominences were well padded.  The hip was prepped and draped in the normal sterile surgical fashion.  A time-out was called verifying side and site of surgery. Antibiotics were given within 60 minutes of beginning the procedure.   The direct anterior approach to the hip was performed through the Hueter interval.  Lateral femoral circumflex vessels were treated with the Auqumantys. The anterior capsule was exposed and an inverted T capsulotomy was made.  Fracture hematoma was encountered and evacuated. The patient was found to have a comminuted Right subcapital femoral neck fracture.  I freshened the femoral neck cut with a saw.  I removed the femoral neck fragment.  A corkscrew was placed into the head and the head was removed.  This was passed to the back table and was measured.   Acetabular exposure was achieved.  I examined the  articular cartilage which was intact.  The labrum was intact. A 44 mm trial head was placed and found to have excellent fit.   I then gained femoral exposure taking care to protect the abductors and greater trochanter.  This was performed using standard external rotation, extension, and adduction.  The capsule was peeled off the inner aspect of the greater trochanter, taking care to preserve the short external rotators. A cookie cutter was used to enter the femoral canal, and then the femoral canal finder was used to confirm location.  I then sequentially broached up to a size 7.  Calcar planer was used on the femoral neck remnant.  I paced a std neck and a 36 + 1.5 head ball. The hip was reduced.  Leg lengths were checked fluoroscopically.  The hip was dislocated and trial components were removed.  I placed the real stem followed by the real spacer and head ball.  A single reduction maneuver was performed and the hip was reduced.  Fluoroscopy was used to confirm component position and leg lengths.  At 90 degrees of external rotation and extension, the hip was stable to an anterior directed force.   The wound was copiously irrigated with Irrisept solution and normal saline using pule lavage.  Marcaine solution was injected into the periarticular soft tissue.  The wound was closed in layers using #1 Vicryl and V-Loc for the fascia, 2-0 Vicryl for the subcutaneous fat, 2-0 Monocryl for the deep dermal layer, and staples + glue for the skin.  Once the glue was fully dried, an Aquacell Ag dressing was applied.  The patient was then awakened  from anesthesia and transported to the recovery room in stable condition.  Sponge, needle, and instrument counts were correct at the end of the case x2.  The patient tolerated the procedure well and there were no known complications.  Please note that a surgical assistant was a medical necessity for this procedure to perform it in a safe and expeditious manner. Assistant was  necessary to provide appropriate retraction of vital neurovascular structures, to prevent femoral fracture, and to allow for anatomic placement of the prosthesis.

## 2020-08-11 NOTE — Anesthesia Procedure Notes (Signed)
Procedure Name: Intubation Date/Time: 08/11/2020 9:47 AM Performed by: Eligha Bridegroom, CRNA Pre-anesthesia Checklist: Patient identified, Emergency Drugs available, Suction available, Patient being monitored and Timeout performed Patient Re-evaluated:Patient Re-evaluated prior to induction Oxygen Delivery Method: Circle system utilized Preoxygenation: Pre-oxygenation with 100% oxygen Induction Type: IV induction Ventilation: Oral airway inserted - appropriate to patient size and Mask ventilation without difficulty Laryngoscope Size: Mac and 3 Grade View: Grade II Tube type: Oral Tube size: 7.0 mm Number of attempts: 1 Airway Equipment and Method: Stylet Placement Confirmation: ETT inserted through vocal cords under direct vision,  positive ETCO2 and breath sounds checked- equal and bilateral Secured at: 21 cm Tube secured with: Tape Dental Injury: Teeth and Oropharynx as per pre-operative assessment

## 2020-08-11 NOTE — Progress Notes (Signed)
Unable to do Profend and mouth wash d/t pt's mental status. Dr. Stephannie Peters made aware of Pt not receiving their beta blocker before coming down for surgery. No new orders received. Will give in OR.

## 2020-08-11 NOTE — Transfer of Care (Signed)
Immediate Anesthesia Transfer of Care Note  Patient: [name]  Procedure(s) Performed: TOTAL HIP ARTHROPLASTY ANTERIOR APPROACH (Right Hip)  Patient Location: PACU  Anesthesia Type:General  Level of Consciousness: drowsy  Airway & Oxygen Therapy: Patient Spontanous Breathing  Post-op Assessment: Report given to RN and Post -op Vital signs reviewed and stable  Post vital signs: Reviewed and stable  Last Vitals:  Vitals Value Taken Time  BP 172/55 08/11/20 1152  Temp    Pulse 41 08/11/20 1153  Resp 13 08/11/20 1153  SpO2 98 % 08/11/20 1153  Vitals shown include unvalidated device data.  Last Pain:  Vitals:   08/11/20 0910  TempSrc: Oral  PainSc:          Complications: No complications documented.

## 2020-08-12 LAB — BASIC METABOLIC PANEL
Anion gap: 8 (ref 5–15)
BUN: 25 mg/dL — ABNORMAL HIGH (ref 8–23)
CO2: 18 mmol/L — ABNORMAL LOW (ref 22–32)
Calcium: 8.1 mg/dL — ABNORMAL LOW (ref 8.9–10.3)
Chloride: 111 mmol/L (ref 98–111)
Creatinine, Ser: 1.24 mg/dL — ABNORMAL HIGH (ref 0.44–1.00)
GFR, Estimated: 38 mL/min — ABNORMAL LOW (ref >60)
Glucose, Bld: 114 mg/dL — ABNORMAL HIGH (ref 70–99)
Potassium: 4.9 mmol/L (ref 3.5–5.1)
Sodium: 137 mmol/L (ref 135–145)

## 2020-08-12 LAB — CBC
HCT: 28.8 % — ABNORMAL LOW (ref 36.0–46.0)
HCT: 26.6 % — ABNORMAL LOW (ref 36.0–46.0)
Hemoglobin: 8.9 g/dL — ABNORMAL LOW (ref 12.0–15.0)
Hemoglobin: 8.4 g/dL — ABNORMAL LOW (ref 12.0–15.0)
MCH: 29.6 pg (ref 26.0–34.0)
MCH: 29.8 pg (ref 26.0–34.0)
MCHC: 30.9 g/dL (ref 30.0–36.0)
MCHC: 31.6 g/dL (ref 30.0–36.0)
MCV: 95.7 fL (ref 80.0–100.0)
MCV: 94.3 fL (ref 80.0–100.0)
Platelets: 94 10*3/uL — ABNORMAL LOW (ref 150–400)
Platelets: 110 10*3/uL — ABNORMAL LOW (ref 150–400)
RBC: 3.01 MIL/uL — ABNORMAL LOW (ref 3.87–5.11)
RBC: 2.82 MIL/uL — ABNORMAL LOW (ref 3.87–5.11)
RDW: 14.2 % (ref 11.5–15.5)
RDW: 14.3 % (ref 11.5–15.5)
WBC: 8.5 10*3/uL (ref 4.0–10.5)
WBC: 10.1 10*3/uL (ref 4.0–10.5)
nRBC: 0 % (ref 0.0–0.2)
nRBC: 0 % (ref 0.0–0.2)

## 2020-08-12 MED ORDER — FERROUS SULFATE 325 (65 FE) MG PO TABS
325.0000 mg | ORAL_TABLET | Freq: Three times a day (TID) | ORAL | Status: DC
  Administered 2020-08-12 – 2020-08-14 (×5): 325 mg via ORAL
  Filled 2020-08-12 (×5): qty 1

## 2020-08-12 MED ORDER — VITAMIN D (ERGOCALCIFEROL) 1.25 MG (50000 UNIT) PO CAPS
50000.0000 [IU] | ORAL_CAPSULE | ORAL | Status: DC
  Administered 2020-08-12: 50000 [IU] via ORAL
  Filled 2020-08-12 (×2): qty 1

## 2020-08-12 NOTE — Plan of Care (Signed)
  Problem: Education: Goal: Knowledge of General Education information will improve Description: Including pain rating scale, medication(s)/side effects and non-pharmacologic comfort measures 08/12/2020 0421 by Darleene Cleaver, RN Outcome: Progressing 08/12/2020 0421 by Darleene Cleaver, RN Outcome: Progressing   Problem: Activity: Goal: Risk for activity intolerance will decrease Outcome: Progressing   Problem: Pain Managment: Goal: General experience of comfort will improve Outcome: Progressing   Problem: Safety: Goal: Ability to remain free from injury will improve Outcome: Progressing   Problem: Skin Integrity: Goal: Risk for impaired skin integrity will decrease Outcome: Progressing

## 2020-08-12 NOTE — Evaluation (Addendum)
Occupational Therapy Evaluation Patient Details Name: [name] MRN: [id number] DOB: 05/24/1916 Today's Date: 08/12/2020    History of Present Illness Pt is a 87 y.o. female who presented to the ED 08/09/20 after an unwitnessed fall resulting in a R femoral neck fx. S/p anterior approach R hip hemiarthroplasty 08/11/20. PMH: Alzheimer's dementia, CKD stage IIIb, HTN, L foot ulcer, and hyperlipidemia.   Clinical Impression   PTA, pt was at [facility] and unsure of PLOF as family not present. Pt currently requiring Total A for ADLs and bed mobility. Pt fearful with movement and requiring comforting cues. Pt presenting with posterior lean at EOB. Engaging in eating task with Total A; requiring Min guard for ~3 minutes while eating. Benefits from music. Pt would benefit from further acute OT to facilitate safe dc. Recommend dc to SNF for further OT to optimize safety, independence with ADLs, and return to PLOF.     Follow Up Recommendations  SNF    Equipment Recommendations  Other (comment) (defer to next venue)    Recommendations for Other Services PT consult     Precautions / Restrictions Precautions Precautions: Fall;Anterior Hip Precaution Booklet Issued: No Precaution Comments: HOH Restrictions Weight Bearing Restrictions: Yes RLE Weight Bearing: Weight bearing as tolerated      Mobility Bed Mobility Overal bed mobility: Needs Assistance Bed Mobility: Supine to Sit;Sit to Supine;Rolling Rolling: Total assist;+2 for physical assistance;+2 for safety/equipment   Supine to sit: Total assist;+2 for physical assistance;+2 for safety/equipment;HOB elevated Sit to supine: Total assist;+2 for physical assistance;+2 for safety/equipment   General bed mobility comments: Pt not assisting actively with any movements due to impaired cognition and pain, TAx2 for all.    Transfers Overall transfer level: Needs assistance   Transfers: Lateral/Scoot Transfers           Lateral/Scoot Transfers: Total assist;+2 physical assistance;+2 safety/equipment General transfer comment: Attempted lateral scoot to R but pt resisting, no success.    Balance Overall balance assessment: Needs assistance Sitting-balance support: Bilateral upper extremity supported Sitting balance-Leahy Scale: Poor Sitting balance - Comments: Pt with UE support, initially needing maxA posteriorly to resist posterior and R lateral lean. Once pt scooted more anteriorly to EOB she was able to hold her static balance for ~3 min with min guard until she fatigued and needed up to modA again. Postural control: Posterior lean;Right lateral lean     Standing balance comment: Unable to stand                           ADL either performed or assessed with clinical judgement   ADL Overall ADL's : Needs assistance/impaired                                       General ADL Comments: Total A for ADLs and bed mobility. Pt engaging eating apple sauce nad chocolate pudding. Washing her face with warm washclothe at end of session     Vision   Additional Comments: Appears to rely on central vision     Perception     Praxis      Pertinent Vitals/Pain Pain Assessment: Faces Faces Pain Scale: Hurts whole lot Pain Location: R hip with mobility Pain Descriptors / Indicators: Discomfort;Grimacing;Guarding;Moaning Pain Intervention(s): Monitored during session;Repositioned;Limited activity within patient's tolerance;Premedicated before session     Hand Dominance     Extremity/Trunk Assessment Upper  Extremity Assessment Upper Extremity Assessment: Generalized weakness   Lower Extremity Assessment Lower Extremity Assessment: Defer to PT evaluation   Cervical / Trunk Assessment Cervical / Trunk Assessment: Kyphotic   Communication Communication Communication: HOH   Cognition Arousal/Alertness: Lethargic Behavior During Therapy: Restless;Anxious Overall Cognitive  Status: History of cognitive impairments - at baseline                                 General Comments: Pt with hx of Alzheimer's dementia. She appears distressed and anxious when performing mobility, moaning, needing continual reassurance. Benefits from calm gentle (loud) voice. Appears to respond well to music and hymns; eye contact and sitting balance improving with therapist singing. Pt not answering questions but saying random words such as "yes", "no you go first" "please dont push me"   General Comments  SpO2 high 80s-low 90s on RA; pleth poor for majority of session    Exercises     Shoulder Instructions      Home Living Family/patient expects to be discharged to:: Skilled nursing facility                                 Additional Comments: From [facility]      Prior Functioning/Environment Level of Independence: Needs assistance  Gait / Transfers Assistance Needed: Per chart review, pt ambulatory with assistance at her facility. ADL's / Homemaking Assistance Needed: No information on BADLs per chart review and family not present. Communication / Swallowing Assistance Needed: Likely has difficulty chewing larger and tougher foods due to lack of teeth          OT Problem List: Decreased strength;Decreased range of motion;Decreased activity tolerance;Impaired balance (sitting and/or standing);Decreased cognition;Decreased safety awareness;Decreased knowledge of use of DME or AE;Decreased knowledge of precautions;Pain      OT Treatment/Interventions: Self-care/ADL training;Therapeutic exercise;Energy conservation;DME and/or AE instruction;Therapeutic activities;Patient/family education    OT Goals(Current goals can be found in the care plan section) Acute Rehab OT Goals Patient Stated Goal: did not state OT Goal Formulation: Patient unable to participate in goal setting Time For Goal Achievement: 08/26/20 Potential to Achieve Goals: Fair  OT  Frequency: Min 2X/week   Barriers to D/C:            Co-evaluation PT/OT/SLP Co-Evaluation/Treatment: Yes Reason for Co-Treatment: For patient/therapist safety;To address functional/ADL transfers;Necessary to address cognition/behavior during functional activity   OT goals addressed during session: ADL's and self-care      AM-PAC OT "6 Clicks" Daily Activity     Outcome Measure Help from another person eating meals?: Total Help from another person taking care of personal grooming?: Total Help from another person toileting, which includes using toliet, bedpan, or urinal?: Total Help from another person bathing (including washing, rinsing, drying)?: Total Help from another person to put on and taking off regular upper body clothing?: Total Help from another person to put on and taking off regular lower body clothing?: Total 6 Click Score: 6   End of Session Nurse Communication: Mobility status;Other (comment) (eating; would benefit from soft foods)  Activity Tolerance: Patient limited by lethargy Patient left: in bed;with call bell/phone within reach;with bed alarm set  OT Visit Diagnosis: Unsteadiness on feet (R26.81);Other abnormalities of gait and mobility (R26.89);Muscle weakness (generalized) (M62.81);Pain Pain - Right/Left: Right Pain - part of body: Leg  Time: 0488-8916 OT Time Calculation (min): 28 min Charges:  OT General Charges $OT Visit: 1 Visit OT Evaluation $OT Eval Moderate Complexity: 1 Mod  Flo Berroa MSOT, OTR/L Acute Rehab Pager: [phone] Office: [phone]  Theodoro Grist Annita Ratliff 08/12/2020, 1:03 PM

## 2020-08-12 NOTE — Progress Notes (Addendum)
   Subjective:  Patient is alert on exam and appears she is back to her baseline. Spoke to daughter yesterday and her plan is to come this morning to see her mother.   Objective:  Vital signs in last 24 hours: Vitals:   08/11/20 1815 08/11/20 2002 08/12/20 0000 08/12/20 0338  BP: (!) 139/42 (!) 146/90 (!) 114/50 (!) 97/47  Pulse: 75 96 89 86  Resp: 16 18 17 16   Temp: 98.4 F (36.9 C) 98.3 F (36.8 C) 97.9 F (36.6 C) 98.5 F (36.9 C)  TempSrc: Oral Oral Axillary Oral  SpO2: 99% 99% 94% 100%  Weight:      Height:       Generally: Elderly women laying on right side , eyes open to voice and she speaks incomprehensible words CV: Regular rate and rhythm no murmurs rubs or gallops Pulmonary: Normal work of breathing, no adventitious breath sounds Extremities: Warm extremities distally in lower extremities  Assessment/Plan:  Principal Problem:   Hip fracture (HCC) Active Problems:   Essential hypertension   Alzheimer disease (HCC)   Asymptomatic bacteriuria  Hospital day #3 for this 87 year old person admitted with left hip fracture now status post surgical repair.  She had prolonged sedation after surgery, expected with Alzheimer's and advanced age.  This morning patient is alert on exam.   #Displaced right femoral neck fracture status post hemiarthroplasty, active - Postop day #1, seen by orthopedics this morning  -Pain control has been discontinued due to concern for sedation and confusion -PT/OT, WBAT  Normocytic anemia Hemoglobin 11.8> 8.9, after surgery. Likely acute blood loss anemia, will trend CBC for recovery. Patient is asymptomatic on exam. Patient started on iron supplementation.    #Hypertension, chronic Normotensive on exam, hold home carvedilol 6.25 mg twice daily  #Vitamin D deficiency, active 25 OH vitamin D noted to be 12.26 -First dose of 50,000 units Ergocalciferol today, supplement for 6 to 8 weeks   Prior to Admission Living Arrangement: [facility] Anticipated Discharge Location: SNF Barriers to Discharge: Continued recovery Dispo: Anticipated discharge in approximately 2-3 day(s).   101, MD 08/12/2020, 6:19 AM Pager: [phone] After 5pm on weekdays and 1pm on weekends: On Call pager [phone]

## 2020-08-12 NOTE — Progress Notes (Signed)
    Subjective: 1 Day Post-Op Procedure(s) (LRB): TOTAL HIP ARTHROPLASTY ANTERIOR APPROACH (Right) Patient reports pain as 2 on 0-10 scale.   Denies CP or SOB.  Voiding without difficulty. Positive flatus. Objective: Vital signs in last 24 hours: Temp:  [97.3 F (36.3 C)-99.1 F (37.3 C)] 98.5 F (36.9 C) (03/05 0338) Pulse Rate:  [64-96] 86 (03/05 0338) Resp:  [12-18] 16 (03/05 0338) BP: (97-172)/(42-90) 97/47 (03/05 0338) SpO2:  [91 %-100 %] 100 % (03/05 0338) Weight:  [54.4 kg] 54.4 kg (03/04 0917)  Intake/Output from previous day: 03/04 0701 - 03/05 0700 In: 1615.5 [I.V.:1508.5; IV Piggyback:107] Out: 100 [Blood:100] Intake/Output this shift: No intake/output data recorded.  Labs: Recent Labs    08/09/20 2328 08/10/20 0815 08/11/20 1532 08/12/20 0254  HGB 11.6* 11.8* 8.9* 8.4*   Recent Labs    08/11/20 1532 08/12/20 0254  WBC 8.5 10.1  RBC 3.01* 2.82*  HCT 28.8* 26.6*  PLT 94* 110*   Recent Labs    08/10/20 0815 08/11/20 1532 08/12/20 0254  NA 135  --  137  K 4.8  --  4.9  CL 105  --  111  CO2 20*  --  18*  BUN 27*  --  25*  CREATININE 1.08* 1.03* 1.24*  GLUCOSE 103*  --  114*  CALCIUM 8.9  --  8.1*   Recent Labs    08/10/20 0033  INR 1.1    Physical Exam: Neurologically intact ABD soft Intact pulses distally Incision: dressing C/D/I and no drainage Compartment soft Body mass index is 24.24 kg/m.   Assessment/Plan: 1 Day Post-Op Procedure(s) (LRB): TOTAL HIP ARTHROPLASTY ANTERIOR APPROACH (Right) Advance diet Up with therapy  Lovenox for DVT prevention WBAT with therapy HCT decreased to 26.6.  Will start iron supplement and monitor labs D/C dilaudid, morphine, and norco 7.5 mg.  Concern given her age for sedation and confusion.    Alvy Beal for Dr. Venita Lick [facility] [phone] 08/12/2020, 6:42 AM

## 2020-08-12 NOTE — TOC Initial Note (Signed)
Transition of Care [facility]) - Initial/Assessment Note    Patient Details  Name: [name] MRN: [id number] Date of Birth: July 27, 1915  [facility] CM/SW Contact:    Carmina Miller, LCSWA Phone Number: 08/12/2020, 12:58 PM  Clinical Narrative:                 CSW reached out to pt's daughter Carney Bern in ref to pt returning back to [employer] with PT, had to leave a vm.         Patient Goals and CMS Choice        Expected Discharge Plan and Services                                                Prior Living Arrangements/Services                       Activities of Daily Living      Permission Sought/Granted                  Emotional Assessment              Admission diagnosis:  Hip fracture (HCC) [S72.009A] Closed fracture of neck of right femur, initial encounter (HCC) [S72.001A] Fall, initial encounter [W19.XXXA] Patient Active Problem List   Diagnosis Date Noted  . Hip fracture (HCC) 08/10/2020  . Alzheimer disease (HCC) 08/10/2020  . Asymptomatic bacteriuria 08/10/2020  . Ulcer of left foot (HCC) 06/16/2014  . HYPERLIPIDEMIA 12/15/2009  . Essential hypertension 12/15/2009  . COUGH 12/15/2009  . DEEP VENOUS THROMBOPHLEBITIS, HX OF 12/15/2009   PCP:  Renaye Rakers, MD Pharmacy:   [facility] [address] [place]), [place] - [address] [place] [address] Phone: [phone] Fax: [phone]     Social Determinants of Health (SDOH) Interventions    Readmission Risk Interventions No flowsheet data found.

## 2020-08-12 NOTE — Evaluation (Signed)
Physical Therapy Evaluation Patient Details Name: [name] MRN: [id number] DOB: 28-Feb-1916 Today's Date: 08/12/2020   History of Present Illness  Pt is a 87 y.o. female who presented to the ED 08/09/20 after an unwitnessed fall resulting in a R femoral neck fx. S/p anterior approach R hip hemiarthroplasty 08/11/20. PMH: Alzheimer's dementia, CKD stage IIIb, HTN, L foot ulcer, and hyperlipidemia.  Clinical Impression  Pt presents with condition above and deficits mentioned below, see PT Problem List. Per chart, PTA she was living at a SNF receiving assistance for ambulation and likely assistance for all ADL's and feeding. No family members present to confirm information and pt unable to due to cognitive deficits at baseline. Pt presents this date with generalized weakness, lack of R leg ROM (likely due to pain as she holds it in hip and knee flexion), and impaired coordination and balance that place her at high risk for falls. Due to pt's impaired cognition she needs continual reassurance that she is safe and TAx2 for all functional mobility due to no initiation with commands. Pt progressed from needing maxA to prevent posterior and R lateral lean sitting statically EOB to only needing min guard assist for up to ~3 min before she fatigued by end of session. Unable to stand as pt resisting this date. Will continue to follow acutely. Recommend follow-up with PT at her SNF to maximize her independence and safety with all functional mobility and maximize her quality of life.    Follow Up Recommendations SNF;Supervision/Assistance - 24 hour    Equipment Recommendations  None recommended by PT    Recommendations for Other Services       Precautions / Restrictions Precautions Precautions: Fall;Anterior Hip Precaution Booklet Issued: No Precaution Comments: HOH Restrictions Weight Bearing Restrictions: Yes RLE Weight Bearing: Weight bearing as tolerated      Mobility  Bed Mobility Overal bed  mobility: Needs Assistance Bed Mobility: Supine to Sit;Sit to Supine;Rolling Rolling: Total assist;+2 for physical assistance;+2 for safety/equipment   Supine to sit: Total assist;+2 for physical assistance;+2 for safety/equipment;HOB elevated Sit to supine: Total assist;+2 for physical assistance;+2 for safety/equipment   General bed mobility comments: Pt not assisting actively with any movements due to impaired cognition and pain, TAx2 for all.    Transfers Overall transfer level: Needs assistance   Transfers: Lateral/Scoot Transfers          Lateral/Scoot Transfers: Total assist;+2 physical assistance;+2 safety/equipment General transfer comment: Attempted lateral scoot to R but pt resisting, no success.  Ambulation/Gait             General Gait Details: Unable to come to stand  Stairs            Wheelchair Mobility    Modified Rankin (Stroke Patients Only)       Balance Overall balance assessment: Needs assistance Sitting-balance support: Bilateral upper extremity supported Sitting balance-Leahy Scale: Poor Sitting balance - Comments: Pt with UE support, initially needing maxA posteriorly to resist posterior and R lateral lean. Once pt scooted more anteriorly to EOB she was able to hold her static balance for ~3 min with min guard until she fatigued and needed up to modA again. Postural control: Posterior lean;Right lateral lean     Standing balance comment: Unable to stand                             Pertinent Vitals/Pain Pain Assessment: Faces Faces Pain Scale: Hurts whole lot Pain  Location: R hip with mobility Pain Descriptors / Indicators: Discomfort;Grimacing;Guarding;Moaning Pain Intervention(s): Limited activity within patient's tolerance;Monitored during session;Repositioned    Home Living Family/patient expects to be discharged to:: Skilled nursing facility                      Prior Function Level of Independence:  Needs assistance   Gait / Transfers Assistance Needed: Per chart review, pt ambulatory with assistance at her facility.  ADL's / Homemaking Assistance Needed: Pt appears to have likely needed assistance with feeding based on how she anticipated a spoon being brough dependently to her mouth.        Hand Dominance        Extremity/Trunk Assessment   Upper Extremity Assessment Upper Extremity Assessment: Defer to OT evaluation    Lower Extremity Assessment Lower Extremity Assessment: Generalized weakness;Difficult to assess due to impaired cognition (Unable to formally assess due to cognition but pt guard R leg in hip and knee flexion in bed, likely due to pain)    Cervical / Trunk Assessment Cervical / Trunk Assessment: Kyphotic  Communication   Communication: HOH  Cognition Arousal/Alertness: Lethargic Behavior During Therapy: Restless;Anxious Overall Cognitive Status: History of cognitive impairments - at baseline                                 General Comments: Pt with hx of Alzheimer's dementia. She appears distressed and anxious when performing mobility, even crying at times, needing continual reassurance that she was ok and safe.      General Comments General comments (skin integrity, edema, etc.): SpO2 high 80s-low 90s on RA    Exercises     Assessment/Plan    PT Assessment Patient needs continued PT services  PT Problem List Decreased strength;Decreased range of motion;Decreased activity tolerance;Decreased balance;Decreased mobility;Decreased coordination;Decreased cognition;Decreased knowledge of use of DME;Decreased safety awareness;Decreased knowledge of precautions;Pain       PT Treatment Interventions DME instruction;Gait training;Functional mobility training;Therapeutic activities;Therapeutic exercise;Balance training;Neuromuscular re-education;Cognitive remediation;Patient/family education;Wheelchair mobility training    PT Goals (Current  goals can be found in the Care Plan section)  Acute Rehab PT Goals Patient Stated Goal: did not state PT Goal Formulation: Patient unable to participate in goal setting Time For Goal Achievement: 08/26/20 Potential to Achieve Goals: Fair    Frequency 7X/week   Barriers to discharge        Co-evaluation PT/OT/SLP Co-Evaluation/Treatment: Yes Reason for Co-Treatment: Necessary to address cognition/behavior during functional activity;For patient/therapist safety;To address functional/ADL transfers           AM-PAC PT "6 Clicks" Mobility  Outcome Measure Help needed turning from your back to your side while in a flat bed without using bedrails?: Total Help needed moving from lying on your back to sitting on the side of a flat bed without using bedrails?: Total Help needed moving to and from a bed to a chair (including a wheelchair)?: Total Help needed standing up from a chair using your arms (e.g., wheelchair or bedside chair)?: Total Help needed to walk in hospital room?: Total Help needed climbing 3-5 steps with a railing? : Total 6 Click Score: 6    End of Session   Activity Tolerance: Patient limited by fatigue;Patient limited by pain Patient left: in bed;with call bell/phone within reach;with bed alarm set Nurse Communication: Mobility status PT Visit Diagnosis: Unsteadiness on feet (R26.81);Muscle weakness (generalized) (M62.81);History of falling (Z91.81);Difficulty in walking, not  elsewhere classified (R26.2);Pain Pain - Right/Left: Right Pain - part of body: Hip    Time: 1208-1236 PT Time Calculation (min) (ACUTE ONLY): 28 min   Charges:   PT Evaluation $PT Eval Moderate Complexity: 1 Mod          Raymond Gurney, PT, DPT Acute Rehabilitation Services  Pager: [phone] Office: [phone]   Jewel Baize 08/12/2020, 12:49 PM

## 2020-08-13 DIAGNOSIS — N179 Acute kidney failure, unspecified: Secondary | ICD-10-CM | POA: Diagnosis not present

## 2020-08-13 DIAGNOSIS — I10 Essential (primary) hypertension: Secondary | ICD-10-CM | POA: Diagnosis not present

## 2020-08-13 DIAGNOSIS — G309 Alzheimer's disease, unspecified: Secondary | ICD-10-CM | POA: Diagnosis not present

## 2020-08-13 DIAGNOSIS — S72001A Fracture of unspecified part of neck of right femur, initial encounter for closed fracture: Secondary | ICD-10-CM | POA: Diagnosis not present

## 2020-08-13 LAB — CBC
HCT: 27.1 % — ABNORMAL LOW (ref 36.0–46.0)
Hemoglobin: 8.5 g/dL — ABNORMAL LOW (ref 12.0–15.0)
MCH: 29.2 pg (ref 26.0–34.0)
MCHC: 31.4 g/dL (ref 30.0–36.0)
MCV: 93.1 fL (ref 80.0–100.0)
Platelets: 115 10*3/uL — ABNORMAL LOW (ref 150–400)
RBC: 2.91 MIL/uL — ABNORMAL LOW (ref 3.87–5.11)
RDW: 14.6 % (ref 11.5–15.5)
WBC: 7.9 10*3/uL (ref 4.0–10.5)
nRBC: 0 % (ref 0.0–0.2)

## 2020-08-13 LAB — BASIC METABOLIC PANEL
Anion gap: 9 (ref 5–15)
BUN: 39 mg/dL — ABNORMAL HIGH (ref 8–23)
CO2: 20 mmol/L — ABNORMAL LOW (ref 22–32)
Calcium: 8.3 mg/dL — ABNORMAL LOW (ref 8.9–10.3)
Chloride: 109 mmol/L (ref 98–111)
Creatinine, Ser: 1.69 mg/dL — ABNORMAL HIGH (ref 0.44–1.00)
GFR, Estimated: 26 mL/min — ABNORMAL LOW (ref >60)
Glucose, Bld: 109 mg/dL — ABNORMAL HIGH (ref 70–99)
Potassium: 5 mmol/L (ref 3.5–5.1)
Sodium: 138 mmol/L (ref 135–145)

## 2020-08-13 MED ORDER — LACTATED RINGERS IV SOLN: INTRAVENOUS | Status: AC

## 2020-08-13 NOTE — Progress Notes (Signed)
Subjective: 2 Days Post-Op Procedure(s) (LRB): TOTAL HIP ARTHROPLASTY ANTERIOR APPROACH (Right) Patient seen in rounds for Dr. Aundria Rud Patient reports pain as mild.   Patient nonverbal this am during rounds, very confused. Unable to get any info from patient  Objective: Vital signs in last 24 hours: Temp:  [97.4 F (36.3 C)-97.8 F (36.6 C)] 97.6 F (36.4 C) (03/06 0503) Pulse Rate:  [77-89] 77 (03/06 0503) Resp:  [16-18] 16 (03/06 0503) BP: (106-125)/(47-83) 125/56 (03/06 0503) SpO2:  [88 %-100 %] 100 % (03/06 0503)  Intake/Output from previous day: 03/05 0701 - 03/06 0700 In: 100 [P.O.:100] Out: 850 [Urine:850] Intake/Output this shift: No intake/output data recorded.  Recent Labs    08/10/20 0815 08/11/20 1532 08/12/20 0254 08/13/20 0112  HGB 11.8* 8.9* 8.4* 8.5*   Recent Labs    08/12/20 0254 08/13/20 0112  WBC 10.1 7.9  RBC 2.82* 2.91*  HCT 26.6* 27.1*  PLT 110* 115*   Recent Labs    08/12/20 0254 08/13/20 0112  NA 137 138  K 4.9 5.0  CL 111 109  CO2 18* 20*  BUN 25* 39*  CREATININE 1.24* 1.69*  GLUCOSE 114* 109*  CALCIUM 8.1* 8.3*   No results for input(s): LABPT, INR in the last 72 hours.  Neurologically intact Neurovascular intact Intact pulses distally Dorsiflexion/Plantar flexion intact Incision: dressing C/D/I Compartment soft   Assessment/Plan: 2 Days Post-Op Procedure(s) (LRB): TOTAL HIP ARTHROPLASTY ANTERIOR APPROACH (Right) Advance diet Up with therapy  Lovenox for DVT prevention WBAT with therapy   Ceasar Mons [phone] 08/13/2020, 7:53 AM

## 2020-08-13 NOTE — Plan of Care (Signed)

## 2020-08-13 NOTE — Evaluation (Signed)
Clinical/Bedside Swallow Evaluation Patient Details  Name: [name] MRN: [id number] Date of Birth: February 25, 1916  Today's Date: 08/13/2020 Time: SLP Start Time (ACUTE ONLY): 1250 SLP Stop Time (ACUTE ONLY): 1306 SLP Time Calculation (min) (ACUTE ONLY): 16 min  Past Medical History:  Past Medical History:  Diagnosis Date  . Dementia (HCC)   . Foot ulcer, left (HCC)    lateral aspect on her ankle  . Hyperlipidemia   . Hypertension    Past Surgical History: History reviewed. No pertinent surgical history. HPI:  Pt is a 87 y.o. female who presented to the ED 08/09/20 after an unwitnessed fall resulting in a R femoral neck fx. S/p anterior approach R hip hemiarthroplasty 08/11/20. PMH: Alzheimer's dementia, CKD stage IIIb, HTN, L foot ulcer, and hyperlipidemia.  CXR was negative for active disease.   Assessment / Plan / Recommendation Clinical Impression  Pt was seen for a bedside swallow evaluation and she presents with oral dysphagia with a suspected cognitive component.  RN reported that pt has dentures, but that they were only able to find her top dentures, therefore pt is not currentlly wearing either set of dentures.  RN additionally reported that pt had prolonged mastication of regular and fork-mashed solids and that she exhibited significant coughing with oatmeal this morning.  Unable to complete cranial nerve examination with pt on this date secondary to difficulfty following commands.  Pt was observed to have oral residue from lunch meal tray (fork-mashed solids) in her oral cavity upon SLP arrival.  SLP completed oral care and was able to remove the majority of the residue.  Pt was seen with trials of thin liquid via straw and puree.  AP transport appeared timely with all trials and no overt s/sx of aspiration were observed.  Recommend diet change to Dysphagia 1 (puree) solids and thin liquids with medications administered crushed in puree.  SLP will briefly f/u to monitor diet tolerance  and to see if pt is appropriate for diet upgrade if dentures are available.  SLP Visit Diagnosis: Dysphagia, oral phase (R13.11)    Aspiration Risk  Mild aspiration risk    Diet Recommendation Dysphagia 1 (Puree);Thin liquid   Liquid Administration via: Cup;Straw Medication Administration: Crushed with puree Supervision: Staff to assist with self feeding;Full supervision/cueing for compensatory strategies Compensations: Minimize environmental distractions;Slow rate;Small sips/bites;Other (Comment) (Check for oral residue)    Other  Recommendations Oral Care Recommendations: Oral care BID;Staff/trained caregiver to provide oral care   Follow up Recommendations Skilled Nursing facility      Frequency and Duration min 2x/week  1 week       Prognosis Prognosis for Safe Diet Advancement: Fair Barriers to Reach Goals: Cognitive deficits      Swallow Study   General HPI: Pt is a 87 y.o. female who presented to the ED 08/09/20 after an unwitnessed fall resulting in a R femoral neck fx. S/p anterior approach R hip hemiarthroplasty 08/11/20. PMH: Alzheimer's dementia, CKD stage IIIb, HTN, L foot ulcer, and hyperlipidemia.  CXR was negative for active disease. Type of Study: Bedside Swallow Evaluation Previous Swallow Assessment: none Diet Prior to this Study: Regular;Thin liquids Temperature Spikes Noted: No Respiratory Status: Room air History of Recent Intubation: No Behavior/Cognition: Alert;Confused;Requires cueing;Doesn't follow directions Oral Cavity Assessment: Within Functional Limits Oral Care Completed by SLP: Yes Oral Cavity - Dentition: Edentulous;Dentures, not available Self-Feeding Abilities: Needs assist Patient Positioning: Upright in bed Baseline Vocal Quality: Low vocal intensity Volitional Cough: Cognitively unable to elicit Volitional Swallow: Unable to  elicit    Oral/Motor/Sensory Function Overall Oral Motor/Sensory Function: Other (comment) (Unable to assess.   Pt was unable to follow commands)   Ice Chips Ice chips: Not tested   Thin Liquid Thin Liquid: Within functional limits Presentation: Straw    Nectar Thick Nectar Thick Liquid: Not tested   Honey Thick Honey Thick Liquid: Not tested   Puree Puree: Within functional limits Presentation: Spoon   Solid     Solid: Not tested Other Comments: Pt with residual solids in oral cavity from lunch     Diamond Herb., M.S., CCC-SLP Acute Rehabilitation Services Office: [phone]  [name] 08/13/2020,1:16 PM

## 2020-08-13 NOTE — NC FL2 (Signed)
[place] MEDICAID FL2 LEVEL OF CARE SCREENING TOOL     IDENTIFICATION  Patient Name: [name] Birthdate: 31-Jul-1915 Sex: female Admission Date (Current Location): 08/09/2020  [facility] and [place] Number:  Producer, television/film/video and Address:  [facility], [address] [place], [place] [address]      Provider Number: [id number]  Attending Physician Name and Address:  Tyson Alias, *  Relative Name and Phone Number:       Current Level of Care: Hospital Recommended Level of Care: Skilled Nursing Facility Prior Approval Number:    Date Approved/Denied:   PASRR Number: [id number]  Discharge Plan: SNF    Current Diagnoses: Patient Active Problem List   Diagnosis Date Noted  . Hip fracture (HCC) 08/10/2020  . Alzheimer disease (HCC) 08/10/2020  . Asymptomatic bacteriuria 08/10/2020  . Ulcer of left foot (HCC) 06/16/2014  . HYPERLIPIDEMIA 12/15/2009  . Essential hypertension 12/15/2009  . COUGH 12/15/2009  . DEEP VENOUS THROMBOPHLEBITIS, HX OF 12/15/2009    Orientation RESPIRATION BLADDER Height & Weight     Self  Normal Incontinent Weight: 120 lb (54.4 kg) Height:  4\' 11"  (149.9 cm)  BEHAVIORAL SYMPTOMS/MOOD NEUROLOGICAL BOWEL NUTRITION STATUS      Incontinent Diet (See discharge summary)  AMBULATORY STATUS COMMUNICATION OF NEEDS Skin   Extensive Assist Verbally                         Personal Care Assistance Level of Assistance  Bathing,Feeding,Dressing Bathing Assistance: Maximum assistance Feeding assistance: Limited assistance Dressing Assistance: Maximum assistance     Functional Limitations Info  Hearing,Sight,Speech Sight Info: Adequate   Speech Info: Adequate    SPECIAL CARE FACTORS FREQUENCY  PT (By licensed PT),OT (By licensed OT)     PT Frequency: 5x a week OT Frequency: 5x a week            Contractures Contractures Info: Not present    Additional Factors Info  Code Status,Allergies  Code Status Info: DNR Allergies Info: Aricept (Donepezil)   Namenda (Memantine)   Penicillin G   Penicillins           Current Medications (08/13/2020):  This is the current hospital active medication list Current Facility-Administered Medications  Medication Dose Route Frequency Provider Last Rate Last Admin  . acetaminophen (TYLENOL) tablet 325-650 mg  325-650 mg Oral Q6H PRN 10/13/2020, MD   650 mg at 08/13/20 1125  . carvedilol (COREG) tablet 6.25 mg  6.25 mg Oral BID WC 10/13/20, MD   6.25 mg at 08/13/20 1000  . docusate sodium (COLACE) capsule 100 mg  100 mg Oral BID 10/13/20, MD   100 mg at 08/13/20 1000  . enoxaparin (LOVENOX) injection 30 mg  30 mg Subcutaneous Q24H 10/13/20, MD   30 mg at 08/13/20 1000  . ferrous sulfate tablet 325 mg  325 mg Oral TID WC 10/13/20, MD   325 mg at 08/13/20 1000  . HYDROcodone-acetaminophen (NORCO/VICODIN) 5-325 MG per tablet 1-2 tablet  1-2 tablet Oral Q4H PRN 10/13/20, MD      . lactated ringers infusion   Intravenous Continuous Samson Frederic, MD 75 mL/hr at 08/13/20 0639 New Bag at 08/13/20 10/13/20  . latanoprost (XALATAN) 0.005 % ophthalmic solution 1 drop  1 drop Both Eyes QHS 3545, MD   1 drop at 08/12/20 2100  . loratadine (CLARITIN) tablet 10 mg  10 mg Oral Daily Swinteck, Java,  MD   10 mg at 08/13/20 1001  . menthol-cetylpyridinium (CEPACOL) lozenge 3 mg  1 lozenge Oral PRN Swinteck, Arlys John, MD       Or  . phenol (CHLORASEPTIC) mouth spray 1 spray  1 spray Mouth/Throat PRN Swinteck, Arlys John, MD      . metoCLOPramide (REGLAN) tablet 5-10 mg  5-10 mg Oral Q8H PRN Swinteck, Arlys John, MD       Or  . metoCLOPramide (REGLAN) injection 5-10 mg  5-10 mg Intravenous Q8H PRN Swinteck, Arlys John, MD      . mirtazapine (REMERON) tablet 7.5 mg  7.5 mg Oral QHS Samson Frederic, MD   7.5 mg at 08/12/20 2100  . ondansetron (ZOFRAN) tablet 4 mg  4 mg Oral Q6H PRN Swinteck, Arlys John, MD       Or  . ondansetron (ZOFRAN)  injection 4 mg  4 mg Intravenous Q6H PRN Swinteck, Arlys John, MD      . polyethylene glycol (MIRALAX / GLYCOLAX) packet 17 g  17 g Oral Daily PRN Swinteck, Arlys John, MD      . senna (SENOKOT) tablet 8.6 mg  1 tablet Oral BID Samson Frederic, MD   8.6 mg at 08/13/20 1001  . timolol (TIMOPTIC) 0.5 % ophthalmic solution 1 drop  1 drop Both Eyes Daily Samson Frederic, MD   1 drop at 08/13/20 1001  . Vitamin D (Ergocalciferol) (DRISDOL) capsule 50,000 Units  50,000 Units Oral Q7 days Albertha Ghee, MD   50,000 Units at 08/12/20 1303     Discharge Medications: Please see discharge summary for a list of discharge medications.  Relevant Imaging Results:  Relevant Lab Results:   Additional Information SSN [id number]  Jimmy Picket, [place]

## 2020-08-13 NOTE — Progress Notes (Signed)
   Subjective:  Patient is alert on exam. She has underlying dementia and not oriented at baseline. Its my impression she is asking for help with her cover and is calm after blankets are pulled up. No signs of grimacing or uncontrolled pain. Attempted to call daughter, no response.   Objective:  Vital signs in last 24 hours: Vitals:   08/12/20 1451 08/12/20 1953 08/12/20 2118 08/13/20 0503  BP: 122/83 106/65  (!) 125/56  Pulse: 80 89  77  Resp: 18 16  16   Temp: 97.8 F (36.6 C) 97.8 F (36.6 C)  97.6 F (36.4 C)  TempSrc: Oral Oral  Oral  SpO2: 91% (!) 88% 94% 100%  Weight:      Height:       General: Elderly woman laying on her right side, alert to voice CV: Regular rate and rhythm no murmurs rubs or gallops Extremities: Lower extremities warm, clean dry and intact dressing over right hip incision. Psych: Cooperative, not agitated, noncommunicative   Assessment/Plan:  Principal Problem:   Hip fracture (HCC) Active Problems:   Essential hypertension   Alzheimer disease (HCC)   Asymptomatic bacteriuria  Hospital day #4, POD#2 for this 87 year old person admitted with left hip fracture now status post surgical repair.    #Displaced right femoral neck fracture status post hemiarthroplasty, active - Postop day #2 -Pain control has been discontinued due to concern for sedation and confusion -PT/OT recommend SNF, patient should be able to go back to [facility] once released by orthopedic surgery for continued PT/OT.   #Normocytic anemia Hemoglobin 11.8> 8.9 after surgery, stable today at 8.5. Likely acute blood loss anemia, will trend CBC for recovery. Orthopedic Surgery started Iron supplementation after surgery.   #AKI Baseline Cr ~1.2, bump in Cr overnight. BUN/Cr >20, likely prerenal in setting of decreased intake. Will bladder scan to evaluate obstruction.  - Bladder Scan - Ins/outs - LR @75ml /hr for 12 hours - AM BMP  #Hypertension, chronic - SBP 125-143 today.  Pulse 61-77. -  hold home carvedilol 6.25 mg twice daily  #Vitamin D deficiency, active 25 OH vitamin D noted to be 12.26 -First dose of 50,000 units Ergocalciferol 08/12/2020, supplement once a week for 6 to 8 weeks   Prior to Admission Living Arrangement: [facility] Anticipated Discharge Location: SNF Barriers to Discharge: Continued recovery Dispo: Anticipated discharge in approximately 1-2 day(s).   , MD 08/13/2020, 6:55 AM Pager: [phone] After 5pm on weekdays and 1pm on weekends: On Call pager [phone]

## 2020-08-14 ENCOUNTER — Encounter (HOSPITAL_COMMUNITY): Payer: Self-pay | Admitting: Student in an Organized Health Care Education/Training Program

## 2020-08-14 ENCOUNTER — Other Ambulatory Visit: Payer: Self-pay

## 2020-08-14 DIAGNOSIS — N179 Acute kidney failure, unspecified: Secondary | ICD-10-CM | POA: Diagnosis not present

## 2020-08-14 DIAGNOSIS — S72001A Fracture of unspecified part of neck of right femur, initial encounter for closed fracture: Secondary | ICD-10-CM | POA: Diagnosis not present

## 2020-08-14 DIAGNOSIS — I10 Essential (primary) hypertension: Secondary | ICD-10-CM | POA: Diagnosis not present

## 2020-08-14 DIAGNOSIS — G309 Alzheimer's disease, unspecified: Secondary | ICD-10-CM | POA: Diagnosis not present

## 2020-08-14 LAB — CBC
HCT: 21.7 % — ABNORMAL LOW (ref 36.0–46.0)
Hemoglobin: 7 g/dL — ABNORMAL LOW (ref 12.0–15.0)
MCH: 30.2 pg (ref 26.0–34.0)
MCHC: 32.3 g/dL (ref 30.0–36.0)
MCV: 93.5 fL (ref 80.0–100.0)
Platelets: 123 10*3/uL — ABNORMAL LOW (ref 150–400)
RBC: 2.32 MIL/uL — ABNORMAL LOW (ref 3.87–5.11)
RDW: 14.8 % (ref 11.5–15.5)
WBC: 7 10*3/uL (ref 4.0–10.5)
nRBC: 0 % (ref 0.0–0.2)

## 2020-08-14 LAB — BASIC METABOLIC PANEL
Anion gap: 8 (ref 5–15)
BUN: 40 mg/dL — ABNORMAL HIGH (ref 8–23)
CO2: 20 mmol/L — ABNORMAL LOW (ref 22–32)
Calcium: 8.1 mg/dL — ABNORMAL LOW (ref 8.9–10.3)
Chloride: 112 mmol/L — ABNORMAL HIGH (ref 98–111)
Creatinine, Ser: 1.21 mg/dL — ABNORMAL HIGH (ref 0.44–1.00)
GFR, Estimated: 40 mL/min — ABNORMAL LOW (ref >60)
Glucose, Bld: 100 mg/dL — ABNORMAL HIGH (ref 70–99)
Potassium: 4.7 mmol/L (ref 3.5–5.1)
Sodium: 140 mmol/L (ref 135–145)

## 2020-08-14 LAB — HEMOGLOBIN AND HEMATOCRIT, BLOOD
HCT: 28.3 % — ABNORMAL LOW (ref 36.0–46.0)
Hemoglobin: 9.1 g/dL — ABNORMAL LOW (ref 12.0–15.0)

## 2020-08-14 LAB — SARS CORONAVIRUS 2 (TAT 6-24 HRS): SARS Coronavirus 2: NEGATIVE

## 2020-08-14 LAB — FERRITIN: Ferritin: 89 ng/mL (ref 11–307)

## 2020-08-14 LAB — PREPARE RBC (CROSSMATCH)

## 2020-08-14 MED ORDER — SODIUM CHLORIDE 0.9% IV SOLUTION: Freq: Once | INTRAVENOUS | Status: DC

## 2020-08-14 MED ORDER — RIVAROXABAN 10 MG PO TABS
10.0000 mg | ORAL_TABLET | Freq: Every day | ORAL | Status: DC
  Administered 2020-08-15: 10 mg via ORAL
  Filled 2020-08-14: qty 1

## 2020-08-14 MED ORDER — ENOXAPARIN SODIUM 30 MG/0.3ML ~~LOC~~ SOLN: 30.0000 mg | SUBCUTANEOUS | 0 refills | Status: DC

## 2020-08-14 MED ORDER — SODIUM CHLORIDE 0.9% IV SOLUTION: Freq: Once | INTRAVENOUS | Status: AC

## 2020-08-14 MED ORDER — POLYSACCHARIDE IRON COMPLEX 150 MG PO CAPS
150.0000 mg | ORAL_CAPSULE | Freq: Every day | ORAL | Status: DC
  Administered 2020-08-15: 150 mg via ORAL
  Filled 2020-08-14: qty 1

## 2020-08-14 MED ORDER — ACETAMINOPHEN 325 MG PO TABS: 325.0000 mg | ORAL_TABLET | Freq: Four times a day (QID) | ORAL | 0 refills | Status: DC | PRN

## 2020-08-14 MED ORDER — FERROUS SULFATE 325 (65 FE) MG PO TABS: 325.0000 mg | ORAL_TABLET | Freq: Every day | ORAL | Status: DC

## 2020-08-14 NOTE — Discharge Summary (Signed)
Name: [name] MRN: [id number] DOB: 16-Jul-1915 87 y.o. PCP: Diamond Rakers, MD  Date of Admission: 08/09/2020 11:11 PM Date of Discharge: 08/15/2020 Attending Physician: [name], *  Discharge Diagnosis: 1. Principal Problem:   Hip fracture [facility]) Active Problems:   Essential hypertension   Alzheimer disease (HCC)   Asymptomatic bacteriuria   AKI (acute kidney injury) (HCC)  Discharge Medications: Allergies as of 08/15/2020      Reactions   Aricept [donepezil]    Namenda [memantine]    Penicillin G    Other reaction(s): Unknown   Penicillins    REACTION: unknown      Medication List    STOP taking these medications   sennosides-docusate sodium 8.6-50 MG tablet Commonly known as: SENOKOT-S     TAKE these medications   acetaminophen 325 MG tablet Commonly known as: TYLENOL Take 1-2 tablets (325-650 mg total) by mouth every 6 (six) hours as needed for mild pain (pain score 1-3 or temp > 100.5). What changed:   how much to take  when to take this  reasons to take this   carvedilol 6.25 MG tablet Commonly known as: COREG Take 6.25 mg by mouth 2 (two) times daily with a meal.   iron polysaccharides 150 MG capsule Commonly known as: NIFEREX Take 1 capsule (150 mg total) by mouth daily. Start taking on: August 16, 2020   latanoprost 0.005 % ophthalmic solution Commonly known as: XALATAN Place 1 drop into both eyes at bedtime.   loratadine 10 MG tablet Commonly known as: CLARITIN Take 10 mg by mouth daily.   mirtazapine 7.5 MG tablet Commonly known as: REMERON Take 7.5 mg by mouth at bedtime.   OXYGEN Inhale 2 L into the lungs See admin instructions. If O2 saturation below 92%   polyethylene glycol 17 g packet Commonly known as: MIRALAX / GLYCOLAX Take 17 g by mouth daily.   rivaroxaban 10 MG Tabs tablet Commonly known as: XARELTO Take 1 tablet (10 mg total) by mouth daily. Start taking on: August 16, 2020   senna 8.6 MG Tabs  tablet Commonly known as: SENOKOT Take 1 tablet (8.6 mg total) by mouth 2 (two) times daily.   timolol 0.5 % ophthalmic solution Commonly known as: BETIMOL Place 1 drop into the right eye daily.   Vitamin D (Ergocalciferol) 1.25 MG (50000 UNIT) Caps capsule Commonly known as: DRISDOL Take 1 capsule (50,000 Units total) by mouth every 7 (seven) days. Start taking on: August 19, 2020            Discharge Care Instructions  (From admission, onward)         Start     Ordered   08/15/20 0000  Discharge wound care:       Comments: Reinforce dressing, follow-up orthopedics   08/15/20 1213   08/15/20 0000  Discharge wound care:       Comments: Reinforce dressing   08/15/20 1213         Disposition and follow-up:   Ms.[name] was discharged from [facility] in [place] condition.  At the hospital follow up visit please address:  1. #Displaced right femoral neck fracture status post hemiarthroplasty - patient was admitted following a fall from bed found to have a right hip fracture and underwent hemiarthroplasty. Patient to increase activity to weight bearing as tolerated with ongoing PT and OT at SNF. Patient's pain well-controlled on acetaminophen alone. Patient to continue rivaroxaban 10mg  daily for 31 days following discharge.  #  Acute blood loss anemia - Patient's hemoglobin decreased from 11.8 to ~8.5 following operation. Patient's hemoglobin dropped further to 7.0 following administration of IVF. Patient underwent transfusion of 1u PRBCs with hemoglobin increased to 7.9 prior to discharge with no active signs or symptoms of ongoing bleed. Please consider obtaining a repeat CBC to evaluate status of patient's anemia. Patient continued on daily iron polysaccharide 150mg  upon discharge.  #AKI - patient developed acute kidney injury following operation in the setting of poor PO intake which resolved following administration of IVF. Please consider obtaining repeat  BMP to assess status of this condition.  2.  Labs / imaging needed at time of follow-up: CBC, BMP  3.  Pending labs/ test needing follow-up: None  Follow-up Appointments:  Follow-up Information    [place], , MD. Schedule an appointment as soon as possible for a visit in 2 weeks.   Specialty: Orthopedic Surgery Why: For wound re-check, For suture removal Contact information: [address] [place] [address]        [phone], MD. Schedule an appointment as soon as possible for a visit in 1 week(s).   Specialty: Family Medicine Contact information: [address] [place] [phone]        Rehab, [phone] [facility] And. Go in 1 day(s).   Specialty: Skilled Nursing Facility Contact information: [address] [place] [facility] [phone]              Hospital Course by problem list:  #Displaced right femoral neck fracture status post hemiarthroplasty #Osteoporosis [name] presented to [facility] on 03[facility] for evaluation following a fall out of her bed. She underwent imaging of the hip which revealed a displaced right femoral neck fracture. Orthopedics was consulted and patient underwent hemiarthroplasty of the right hip. Following the operation, patient had extended period of sedation but recovered over the subsequent hours. Patient returned to her baseline and daughter reported that she appeared better than prior to her injury. Physical therapy and occupational therapy consulted with recommendation for patient to discharge to SNF. Patient's activity increased to weight bearing as tolerated and pain appropriately controlled with acetaminophen alone. Although patient experienced fragility fracture, plan to hold off on starting bisphosphonate for patient's osteoporosis as therapy is unlikely to be meaningful for patient. Patient kept on enoxaparin therapy throughout hospitalization and transitioned to rivaroxaban  daily dosing upon discharge. Patient discharged to [facility] for continued rehabilitation.  #Acute blood loss anemia Patient presented with hemoglobin of 11.8 which trended down to ~8.5 following operation. Yesterday, patient received IV fluids for acute kidney injury and had worsening anemia to 7.0 on morning CBC. Patient's anemia secondary to blood loss from recent operation and component of dilution. Ferritin of 89. No signs or symptoms suggestive of active bleed. Patient received 1 unit of PRBCs with follow-up hemoglobin of 7.9. Patient discharged with once daily dosing of iron polysaccharide 150mg  daily.  #Hypertension Patient's blood pressure mildly elevated throughout hospitalization in setting of pain following operation. Patient continued on home carvedilol 6.25mg  twice daily.  #Vitamin D deficiency 20 OH vitamin D noted to be 12.26. Patient received first dose of 50,000 IU Vitamin D3 on 03/05. Plan for patient to receive weekly supplementation for 6-8 weeks total.  Pertinent Labs, Studies, and Procedures:  CBC Latest Ref Rng & Units 08/15/2020 08/14/2020 08/14/2020  WBC 4.0 - 10.5 K/uL 6.4 - 7.0  Hemoglobin 12.0 - 15.0 g/dL 7.9(L) 9.1(L) 7.0(L)  Hematocrit 36.0 - 46.0 %  26.7(L) 28.3(L) 21.7(L)  Platelets 150 - 400 K/uL 129(L) - 123(L)   CMP Latest Ref Rng & Units 08/15/2020 08/14/2020 08/13/2020  Glucose 70 - 99 mg/dL 403(K) 742(V) 956(L)  BUN 8 - 23 mg/dL 87(F) 64(P) 32(R)  Creatinine 0.44 - 1.00 mg/dL 5.18 8.41(Y) 6.06(T)  Sodium 135 - 145 mmol/L 141 140 138  Potassium 3.5 - 5.1 mmol/L 4.0 4.7 5.0  Chloride 98 - 111 mmol/L 110 112(H) 109  CO2 22 - 32 mmol/L 24 20(L) 20(L)  Calcium 8.9 - 10.3 mg/dL 0.1(S) 8.1(L) 8.3(L)  Total Protein 6.0 - 8.3 g/dL - - -  Total Bilirubin 0.3 - 1.2 mg/dL - - -  Alkaline Phos 39 - 117 U/L - - -  AST 0 - 37 U/L - - -  ALT 0 - 35 U/L - - -  Pelvis Portable  Result Date: 08/11/2020 CLINICAL DATA:  RIGHT femoral neck fracture, hip replacement EXAM:  PORTABLE PELVIS 1-2 VIEWS COMPARISON:  Portable exam 1229 hours compared to 08/11/2020 intraoperative images FINDINGS: RIGHT hip prosthesis identified. Bones demineralized. Hip and SI joints preserved. No fracture or dislocation seen. Calcified uterine leiomyomata in pelvis. Atherosclerotic calcifications aorta. IMPRESSION: RIGHT hip prosthesis without acute complication. Osseous demineralization. Calcified uterine leiomyomata. Electronically Signed   By: Ulyses Southward M.D.   On: 08/11/2020 14:24   DG Knee Right Port  Result Date: 08/11/2020 CLINICAL DATA:  Postop right hip ORIF EXAM: PORTABLE RIGHT KNEE - 1-2 VIEW COMPARISON:  None. FINDINGS: No evidence of fracture or dislocation. Moderate knee joint effusion, nonspecific. Joint spaces are relatively preserved. Bones are mildly demineralized. Vascular calcifications. Soft tissues are unremarkable. IMPRESSION: 1. No evidence of acute fracture or dislocation. 2. Moderate knee joint effusion, nonspecific. Electronically Signed   By: Duanne Guess D.O.   On: 08/11/2020 13:09   Discharge Instructions: Discharge Instructions    Call MD for:  difficulty breathing, headache or visual disturbances   Complete by: As directed    Call MD for:  difficulty breathing, headache or visual disturbances   Complete by: As directed    Call MD for:  extreme fatigue   Complete by: As directed    Call MD for:  extreme fatigue   Complete by: As directed    Call MD for:  hives   Complete by: As directed    Call MD for:  hives   Complete by: As directed    Call MD for:  persistant dizziness or light-headedness   Complete by: As directed    Call MD for:  persistant dizziness or light-headedness   Complete by: As directed    Call MD for:  persistant nausea and vomiting   Complete by: As directed    Call MD for:  persistant nausea and vomiting   Complete by: As directed    Call MD for:  redness, tenderness, or signs of infection (pain, swelling, redness, odor or  green/yellow discharge around incision site)   Complete by: As directed    Call MD for:  redness, tenderness, or signs of infection (pain, swelling, redness, odor or green/yellow discharge around incision site)   Complete by: As directed    Call MD for:  severe uncontrolled pain   Complete by: As directed    Call MD for:  severe uncontrolled pain   Complete by: As directed    Call MD for:  temperature >100.4   Complete by: As directed    Call MD for:  temperature >100.4   Complete by: As  directed    Diet - low sodium heart healthy   Complete by: As directed    Discharge wound care:   Complete by: As directed    Reinforce dressing, follow-up orthopedics   Discharge wound care:   Complete by: As directed    Reinforce dressing   Increase activity slowly   Complete by: As directed    Increase activity slowly   Complete by: As directed      Signed: Roylene ReasonJohnson, Aidyn Kellis, MD 08/15/2020, 12:14 PM   Pager: [phone]

## 2020-08-14 NOTE — Progress Notes (Signed)
1 unit of PRBCs have completed without difficulty.  The patient remains pleasantly confused but cooperative.

## 2020-08-14 NOTE — Care Management Important Message (Signed)
Important Message  Patient Details  Name: Kaity Pitstick MRN: [id number] Date of Birth: 02-01-1916   Medicare Important Message Given:  Yes     Megan P Shular 08/14/2020, 2:13 PM

## 2020-08-14 NOTE — Progress Notes (Signed)
    Subjective:  Patient is sleeping during rounds. Responsive to painful stimuli.  Nonverbal this AM.  Patient's nurse states that patient has been this way over the weekend  Objective:   VITALS:   Vitals:   08/13/20 1934 08/13/20 2108 08/14/20 0433 08/14/20 0730  BP: 117/81  140/67 (!) 158/76  Pulse: (!) 103 75 78 83  Resp: 17 16 16 17   Temp: (!) 97.4 F (36.3 C)  98.8 F (37.1 C) 98.2 F (36.8 C)  TempSrc: Oral  Axillary Oral  SpO2:  100% 100% 96%  Weight:      Height:        NAD ABD soft Neurovascular intact Sensation intact distally Intact pulses distally Incision: dressing C/D/I Unable to assess plantar and dorsiflexion due to patient's mental status  Lab Results  Component Value Date   WBC 7.0 08/14/2020   HGB 7.0 (L) 08/14/2020   HCT 21.7 (L) 08/14/2020   MCV 93.5 08/14/2020   PLT 123 (L) 08/14/2020   BMET    Component Value Date/Time   NA 140 08/14/2020 0400   K 4.7 08/14/2020 0400   CL 112 (H) 08/14/2020 0400   CO2 20 (L) 08/14/2020 0400   GLUCOSE 100 (H) 08/14/2020 0400   BUN 40 (H) 08/14/2020 0400   CREATININE 1.21 (H) 08/14/2020 0400   CALCIUM 8.1 (L) 08/14/2020 0400   GFRNONAA 40 (L) 08/14/2020 0400   GFRAA 42 (L) 06/21/2014 1839     Assessment/Plan: 3 Days Post-Op   Principal Problem:   Hip fracture (HCC) Active Problems:   Essential hypertension   Alzheimer disease (HCC)   Asymptomatic bacteriuria   AKI (acute kidney injury) (HCC)   WBAT with walker DVT ppx: Lovenox, SCDs, TEDS ABLA: HgB 7.0 this AM, down from 8.5 yesterday. Treat per hospitalist recommendations PO pain control, avoiding narcotics due to patient's mental status and sedation risk PT/OT Dispo: Patient clear from an orthopedic standpoint for D/C to SNF for continued PT/OT     08/20/2014 08/14/2020, 8:07 AM [facility] is now [facility] [address], [place], [place] Phone:  [phone] [email or web address] Facebook  [phone]

## 2020-08-14 NOTE — Progress Notes (Signed)
PT Cancellation Note  Patient Details Name: Thi Klich MRN: [id number] DOB: [birth year]   Cancelled Treatment:    Reason Eval/Treat Not Completed: Other (comment).  Pt was seen for attempted visit but has new tx with blood hanging, will retry at another time.   Ivar Drape 08/14/2020, 12:01 PM   Samul Dada, PT MS Acute Rehab Dept. Number: [facility] [id number] and [facility] [phone]

## 2020-08-14 NOTE — Discharge Instructions (Signed)
Dr. Rod Can Joint Replacement Specialist [facility] [address] [place], [place] [address] [phone]   TOTAL HIP REPLACEMENT POSTOPERATIVE DIRECTIONS    Hip Rehabilitation, Guidelines Following Surgery   WEIGHT BEARING Weight bearing as tolerated with assist device (walker, cane, etc) as directed, use it as long as suggested by your surgeon or therapist, typically at least 4-6 weeks.  The results of a hip operation are greatly improved after range of motion and muscle strengthening exercises. Follow all safety measures which are given to protect your hip. If any of these exercises cause increased pain or swelling in your joint, decrease the amount until you are comfortable again. Then slowly increase the exercises. Call your caregiver if you have problems or questions.   HOME CARE INSTRUCTIONS  Most of the following instructions are designed to prevent the dislocation of your new hip.  Remove items at home which could result in a fall. This includes throw rugs or furniture in walking pathways.  Continue medications as instructed at time of discharge.  You may have some home medications which will be placed on hold until you complete the course of blood thinner medication.  You may start showering once you are discharged home. Do not remove your dressing. Do not put on socks or shoes without following the instructions of your caregivers.   Sit on chairs with arms. Use the chair arms to help push yourself up when arising.  Arrange for the use of a toilet seat elevator so you are not sitting low.   Walk with walker as instructed.  You may resume a sexual relationship in one month or when given the OK by your caregiver.  Use walker as long as suggested by your caregivers.  You may put full weight on your legs and walk as much as is comfortable. Avoid periods of inactivity such as sitting longer than an hour when not asleep. This helps prevent  blood clots.  You may return to work once you are cleared by Engineer, production.  Do not drive a car for 6 weeks or until released by your surgeon.  Do not drive while taking narcotics.  Wear elastic stockings for two weeks following surgery during the day but you may remove then at night.  Make sure you keep all of your appointments after your operation with all of your doctors and caregivers. You should call the office at the above phone number and make an appointment for approximately two weeks after the date of your surgery. Please pick up a stool softener and laxative for home use as long as you are requiring pain medications.  ICE to the affected hip every three hours for 30 minutes at a time and then as needed for pain and swelling. Continue to use ice on the hip for pain and swelling from surgery. You may notice swelling that will progress down to the foot and ankle.  This is normal after surgery.  Elevate the leg when you are not up walking on it.   It is important for you to complete the blood thinner medication as prescribed by your doctor.  Continue to use the breathing machine which will help keep your temperature down.  It is common for your temperature to cycle up and down following surgery, especially at night when you are not up moving around and exerting yourself.  The breathing machine keeps your lungs expanded and your temperature down.  RANGE OF MOTION AND STRENGTHENING EXERCISES  These exercises  are designed to help you keep full movement of your hip joint. Follow your caregiver's or physical therapist's instructions. Perform all exercises about fifteen times, three times per day or as directed. Exercise both hips, even if you have had only one joint replacement. These exercises can be done on a training (exercise) mat, on the floor, on a table or on a bed. Use whatever works the best and is most comfortable for you. Use music or television while you are exercising so that the exercises  are a pleasant break in your day. This will make your life better with the exercises acting as a break in routine you can look forward to.  Lying on your back, slowly slide your foot toward your buttocks, raising your knee up off the floor. Then slowly slide your foot back down until your leg is straight again.  Lying on your back spread your legs as far apart as you can without causing discomfort.  Lying on your side, raise your upper leg and foot straight up from the floor as far as is comfortable. Slowly lower the leg and repeat.  Lying on your back, tighten up the muscle in the front of your thigh (quadriceps muscles). You can do this by keeping your leg straight and trying to raise your heel off the floor. This helps strengthen the largest muscle supporting your knee.  Lying on your back, tighten up the muscles of your buttocks both with the legs straight and with the knee bent at a comfortable angle while keeping your heel on the floor.   SKILLED REHAB INSTRUCTIONS: If the patient is transferred to a skilled rehab facility following release from the hospital, a list of the current medications will be sent to the facility for the patient to continue.  When discharged from the skilled rehab facility, please have the facility set up the patient's Home Health Physical Therapy prior to being released. Also, the skilled facility will be responsible for providing the patient with their medications at time of release from the facility to include their pain medication and their blood thinner medication. If the patient is still at the rehab facility at time of the two week follow up appointment, the skilled rehab facility will also need to assist the patient in arranging follow up appointment in our office and any transportation needs.  MAKE SURE YOU:  Understand these instructions.  Will watch your condition.  Will get help right away if you are not doing well or get worse.  Pick up stool softner and  laxative for home use following surgery while on pain medications. Do not remove your dressing. The dressing is waterproof--it is OK to take showers. Continue to use ice for pain and swelling after surgery. Do not use any lotions or creams on the incision until instructed by your surgeon. Total Hip Protocol.   Information on my medicine - XARELTO (Rivaroxaban)  This medication education was reviewed with me or my healthcare representative as part of my discharge preparation.    Why was Xarelto prescribed for you? Xarelto was prescribed for you to reduce the risk of blood clots forming after orthopedic surgery. The medical term for these abnormal blood clots is venous thromboembolism (VTE).  What do you need to know about xarelto ? Take your Xarelto ONCE DAILY at the same time every day. You may take it either with or without food.  If you have difficulty swallowing the tablet whole, you may crush it and mix in applesauce just  prior to taking your dose.  Take Xarelto exactly as prescribed by your doctor and DO NOT stop taking Xarelto without talking to the doctor who prescribed the medication.  Stopping without other VTE prevention medication to take the place of Xarelto may increase your risk of developing a clot.  After discharge, you should have regular check-up appointments with your healthcare provider that is prescribing your Xarelto.    What do you do if you miss a dose? If you miss a dose, take it as soon as you remember on the same day then continue your regularly scheduled once daily regimen the next day. Do not take two doses of Xarelto on the same day.   Important Safety Information A possible side effect of Xarelto is bleeding. You should call your healthcare provider right away if you experience any of the following: ? Bleeding from an injury or your nose that does not stop. ? Unusual colored urine (red or dark brown) or unusual colored stools (red or  black). ? Unusual bruising for unknown reasons. ? A serious fall or if you hit your head (even if there is no bleeding).  Some medicines may interact with Xarelto and might increase your risk of bleeding while on Xarelto. To help avoid this, consult your healthcare provider or pharmacist prior to using any new prescription or non-prescription medications, including herbals, vitamins, non-steroidal anti-inflammatory drugs (NSAIDs) and supplements.  This website has more information on Xarelto: VisitDestination.com.br.
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
## 2020-08-14 NOTE — Progress Notes (Addendum)
Subjective:   Overnight, no acute events.  This morning, patient unable to participate in interview and responds with "yes" or moans to questioning.  Objective:  Vital signs in last 24 hours: Vitals:   08/13/20 2108 08/14/20 0433 08/14/20 0730 08/14/20 0850  BP:  140/67 (!) 158/76 (!) 158/76  Pulse: 75 78 83 83  Resp: 16 16 17    Temp:  98.8 F (37.1 C) 98.2 F (36.8 C)   TempSrc:  Axillary Oral   SpO2: 100% 100% 96%   Weight:      Height:        Intake/Output Summary (Last 24 hours) at 08/14/2020 1021 Last data filed at 08/14/2020 0700 Gross per 24 hour  Intake 800.49 ml  Output 1050 ml  Net -249.51 ml   Filed Weights   08/11/20 0917  Weight: 54.4 kg  Physical Exam Vitals reviewed.  Constitutional:      General: She is not in acute distress.    Appearance: She is ill-appearing.     Comments: Elderly woman supine in hospital bed with eyes closed with incoherent responses to questions  Cardiovascular:     Pulses: Normal pulses.  Pulmonary:     Effort: Pulmonary effort is normal. No respiratory distress.  Musculoskeletal:        General: No swelling.     Right lower leg: No edema.  Skin:    General: Skin is warm and dry.     Coloration: Skin is not pale.     Findings: No erythema.     Comments: Dressing overlying right hip clean, dry and intact  Neurological:     Mental Status: Mental status is at baseline. She is disoriented.    Labs in last 24 hours: CBC Latest Ref Rng & Units 08/14/2020 08/13/2020 08/12/2020  WBC 4.0 - 10.5 K/uL 7.0 7.9 10.1  Hemoglobin 12.0 - 15.0 g/dL 7.0(L) 8.5(L) 8.4(L)  Hematocrit 36.0 - 46.0 % 21.7(L) 27.1(L) 26.6(L)  Platelets 150 - 400 K/uL 123(L) 115(L) 110(L)   BMP Latest Ref Rng & Units 08/14/2020 08/13/2020 08/12/2020  Glucose 70 - 99 mg/dL 10/12/2020) 629(U) 765(Y)  BUN 8 - 23 mg/dL 650(P) 54(S) 56(C)  Creatinine 0.44 - 1.00 mg/dL 12(X) 5.17(G) 0.17(C)  Sodium 135 - 145 mmol/L 140 138 137  Potassium 3.5 - 5.1 mmol/L 4.7 5.0 4.9   Chloride 98 - 111 mmol/L 112(H) 109 111  CO2 22 - 32 mmol/L 20(L) 20(L) 18(L)  Calcium 8.9 - 10.3 mg/dL 8.1(L) 8.3(L) 8.1(L)   Ferritin - 89  Imaging in last 24 hours: No results found.  Assessment/Plan:  Principal Problem:   Hip fracture (HCC) Active Problems:   Essential hypertension   Alzheimer disease (HCC)   Asymptomatic bacteriuria   AKI (acute kidney injury) (HCC)  [name] is a 87 year old woman with past medical history significant for Alzheimer disease and recurrent falls who presented to [facility] on 08/09/2020 following a fall found to have a displaced right femoral neck fracture now status post hemiarthroplasty with hospital course complicated by acute blood loss anemia.   #Displaced right femoral neck fracture status post hemiarthroplasty, active #Osteoporosis, chronic Patient recovering from operation well. Orthopedics recommending weight bearing as tolerated with walker. Treating pain without centrally acting medications due to risk of over-sedation. Will hold off on starting bisphosphonate for patient's osteoporosis as therapy is unlikely to be meaningful for patient. -Continue PT and OT at SNF ([facility]) -Continue weight bearing as tolerated with walker -Continue acetaminophen PRN -Will need to continue  anticoagulation for 35 days following operation, will transition to rivaroxaban 10mg  daily   #Acute blood loss anemia, active Patient presented with hemoglobin of 11.8 which trended down to ~8.5 following operation. Yesterday, patient received IV fluids for acute kidney injury and had worsening anemia to 7.0 on morning CBC. Patient's anemia secondary to blood loss from recent operation and component of dilution. Ferritin of 89. No signs or symptoms suggestive of active bleed. -Transfuse 1 unit of PRBCs now -Post transfusion CBC -iron polysaccharide 150mg  daily   #Hypertension, chronic Patient's blood pressure mildly elevated though in setting of pain  following operation. Will not increase patient's antihypertensive regimen at this time. -Continue home carvedilol 6.25mg  twice daily   #Vitamin D deficiency, chronic 25 OH vitamin D noted to be 12.26. Patient received first dose of 50,000 IU Vitamin D3 on 03/05 -Continue supplementation for 6-8 weeks total  #Bowel regimen: senna 8.6mg  twice daily, Miralax 17g daily PRN #VTE ppx: Transition to rivaroxaban 10mg  daily #Code status: DNR #Diet: Dysphagia 1  Prior to Admission Living Arrangement: [facility] Anticipated Discharge Location: [facility] Barriers to Discharge: Blood transfusion, pending COVID test, bed availability at : Anticipated discharge in approximately 0-1 day(s).   05/05, MD 08/14/2020, 10:21 AM Pager: [phone] After 5pm on weekdays and 1pm on weekends: On Call pager [phone]

## 2020-08-14 NOTE — Plan of Care (Signed)

## 2020-08-14 NOTE — Plan of Care (Signed)
  Problem: Education: Goal: Knowledge of General Education information will improve Description: Including pain rating scale, medication(s)/side effects and non-pharmacologic comfort measures Outcome: Not Progressing   Problem: Health Behavior/Discharge Planning: Goal: Ability to manage health-related needs will improve Outcome: Not Progressing   Problem: Clinical Measurements: Goal: Ability to maintain clinical measurements within normal limits will improve Outcome: Not Progressing  Pt is confused 

## 2020-08-14 NOTE — TOC Progression Note (Signed)
Transition of Care [facility]) - Progression Note    Patient Details  Name: [name] MRN: [id number] Date of Birth: January 25, 1916  Transition of Care [facility]) CM/SW Contact  Carley Hammed, [place] Phone Number: 08/14/2020, 3:20 PM  Clinical Narrative:    CSW was notified that pt was ready to discharge to facility by MD. It was noted that she needed a new covid test and this was ordered. Due to it not resulting by a certain time. The facility is requesting she be sent in the morning. [facility] was notified and Marylene Land the SW was trying to get authorization for [facility] to take her. Her # is [phone]. Daughter and MD notified of delay. Transport papers and DNR are on the chart. SW will continue to follow.         Expected Discharge Plan and Services                                                 Social Determinants of Health (SDOH) Interventions    Readmission Risk Interventions No flowsheet data found.

## 2020-08-15 DIAGNOSIS — I10 Essential (primary) hypertension: Secondary | ICD-10-CM | POA: Diagnosis not present

## 2020-08-15 DIAGNOSIS — S72001A Fracture of unspecified part of neck of right femur, initial encounter for closed fracture: Secondary | ICD-10-CM | POA: Diagnosis not present

## 2020-08-15 DIAGNOSIS — G309 Alzheimer's disease, unspecified: Secondary | ICD-10-CM | POA: Diagnosis not present

## 2020-08-15 DIAGNOSIS — N179 Acute kidney failure, unspecified: Secondary | ICD-10-CM | POA: Diagnosis not present

## 2020-08-15 LAB — TYPE AND SCREEN
ABO/RH(D): A POS
Antibody Screen: NEGATIVE
Unit division: 0

## 2020-08-15 LAB — BASIC METABOLIC PANEL
Anion gap: 7 (ref 5–15)
BUN: 26 mg/dL — ABNORMAL HIGH (ref 8–23)
CO2: 24 mmol/L (ref 22–32)
Calcium: 8.5 mg/dL — ABNORMAL LOW (ref 8.9–10.3)
Chloride: 110 mmol/L (ref 98–111)
Creatinine, Ser: 0.94 mg/dL (ref 0.44–1.00)
GFR, Estimated: 53 mL/min — ABNORMAL LOW (ref >60)
Glucose, Bld: 110 mg/dL — ABNORMAL HIGH (ref 70–99)
Potassium: 4 mmol/L (ref 3.5–5.1)
Sodium: 141 mmol/L (ref 135–145)

## 2020-08-15 LAB — BPAM RBC
Blood Product Expiration Date: 202204012359
ISSUE DATE / TIME: 202203071115
Unit Type and Rh: 6200

## 2020-08-15 LAB — CBC
HCT: 26.7 % — ABNORMAL LOW (ref 36.0–46.0)
Hemoglobin: 7.9 g/dL — ABNORMAL LOW (ref 12.0–15.0)
MCH: 28.9 pg (ref 26.0–34.0)
MCHC: 29.6 g/dL — ABNORMAL LOW (ref 30.0–36.0)
MCV: 97.8 fL (ref 80.0–100.0)
Platelets: 129 10*3/uL — ABNORMAL LOW (ref 150–400)
RBC: 2.73 MIL/uL — ABNORMAL LOW (ref 3.87–5.11)
RDW: 16.6 % — ABNORMAL HIGH (ref 11.5–15.5)
WBC: 6.4 10*3/uL (ref 4.0–10.5)
nRBC: 0 % (ref 0.0–0.2)

## 2020-08-15 MED ORDER — ACETAMINOPHEN 325 MG PO TABS: 325.0000 mg | ORAL_TABLET | Freq: Four times a day (QID) | ORAL | 0 refills | Status: AC | PRN

## 2020-08-15 MED ORDER — RIVAROXABAN 10 MG PO TABS: 10.0000 mg | ORAL_TABLET | Freq: Every day | ORAL | 0 refills | Status: AC

## 2020-08-15 MED ORDER — SENNA 8.6 MG PO TABS: 1.0000 | ORAL_TABLET | Freq: Two times a day (BID) | ORAL | 0 refills | Status: AC

## 2020-08-15 MED ORDER — POLYETHYLENE GLYCOL 3350 17 G PO PACK: 17.0000 g | PACK | Freq: Every day | ORAL | 0 refills | Status: AC

## 2020-08-15 MED ORDER — VITAMIN D (ERGOCALCIFEROL) 1.25 MG (50000 UNIT) PO CAPS: 50000.0000 [IU] | ORAL_CAPSULE | ORAL | 0 refills | Status: AC

## 2020-08-15 MED ORDER — POLYSACCHARIDE IRON COMPLEX 150 MG PO CAPS: 150.0000 mg | ORAL_CAPSULE | Freq: Every day | ORAL | 0 refills | Status: AC

## 2020-08-15 NOTE — Progress Notes (Signed)
Writer called [employer] and gave report to [place] [phone]). Patient picked up by [facility] and transported off unit with all patient belongings and discharge packet.

## 2020-08-15 NOTE — Progress Notes (Signed)
°  Speech Language Pathology Treatment: Dysphagia  Patient Details Name: [name] MRN: [id number] DOB: [birth year] Today's Date: 08/15/2020 Time: 0900-0920 SLP Time Calculation (min) (ACUTE ONLY): 20 min  Assessment / Plan / Recommendation Clinical Impression  Pt seen with am meal. Pt sleeping but awoke with lights on and gentle touch. She was resistant to sitting up, but tolerated HOB elevated with reverse Trendelenberg. Pt was then willing to accept bites of pureed solids with adequate oral manipulation and no excessive oral residue. Pt also automatically sipped from straw appropriately when offered. Pt consumed about 50% of her meal. Recommend pt continue current diet as the texture is tolerable given her mentation and edentulous status into the long term. SLP will sign off.   HPI HPI: Pt is a 87 y.o. female who presented to the ED 08/09/20 after an unwitnessed fall resulting in a R femoral neck fx. S/p anterior approach R hip hemiarthroplasty 08/11/20. PMH: Alzheimer's dementia, CKD stage IIIb, HTN, L foot ulcer, and hyperlipidemia.  CXR was negative for active disease.      SLP Plan  Continue with current plan of care       Recommendations  Diet recommendations: Dysphagia 1 (puree);Thin liquid Liquids provided via: Straw;Cup Medication Administration: Crushed with puree Supervision: Trained caregiver to feed patient;Full supervision/cueing for compensatory strategies Compensations: Minimize environmental distractions;Slow rate;Small sips/bites;Other (Comment) Postural Changes and/or Swallow Maneuvers: Seated upright 90 degrees                Oral Care Recommendations: Oral care BID Follow up Recommendations: Skilled Nursing facility SLP Visit Diagnosis: Dysphagia, oral phase (R13.11) Plan: Continue with current plan of care       GO                Opie Maclaughlin, Riley Nearing 08/15/2020, 10:43 AM

## 2020-08-15 NOTE — Plan of Care (Signed)

## 2020-08-15 NOTE — Progress Notes (Signed)
Subjective:   Overnight, no acute events.  This morning, patient unable to provide interval history. She has incomprehensible, rapid speech when removing her blanket to assess her left lower extremity.  Objective:  Vital signs in last 24 hours: Vitals:   08/14/20 1732 08/14/20 1945 08/15/20 0503 08/15/20 0721  BP: (!) 137/40 (!) 141/86 (!) 180/145 (!) 157/49  Pulse: 85 83 93 67  Resp:  17 16 17   Temp:  97.9 F (36.6 C) 98.2 F (36.8 C) 98.4 F (36.9 C)  TempSrc:    Oral  SpO2:  100% 95% 94%  Weight:      Height:      On room air  Intake/Output Summary (Last 24 hours) at 08/15/2020 1154 Last data filed at 08/15/2020 0800 Gross per 24 hour  Intake 1149 ml  Output 800 ml  Net 349 ml   Physical Exam Constitutional:      General: She is not in acute distress.    Comments: 87 year old woman supine in hospital bed who opens eyes to verbal stimulation with incoherent speech  Cardiovascular:     Pulses: Normal pulses.     Comments: Strong dorsalis pedis pulses of the bilateral feet Pulmonary:     Effort: Pulmonary effort is normal. No respiratory distress.  Skin:    General: Skin is warm and dry.     Comments: Incision bandage clean, dry, intact overlying the left hip  Neurological:     Mental Status: Mental status is at baseline. She is disoriented.  Psychiatric:        Mood and Affect: Mood normal.        Behavior: Behavior normal.    Labs in last 24 hours: CBC Latest Ref Rng & Units 08/15/2020 08/14/2020 08/14/2020  WBC 4.0 - 10.5 K/uL 6.4 - 7.0  Hemoglobin 12.0 - 15.0 g/dL 7.9(L) 9.1(L) 7.0(L)  Hematocrit 36.0 - 46.0 % 26.7(L) 28.3(L) 21.7(L)  Platelets 150 - 400 K/uL 129(L) - 123(L)   Imaging in last 24 hours: No results found.  Assessment/Plan:  Principal Problem:   Hip fracture (HCC) Active Problems:   Essential hypertension   Alzheimer disease (HCC)   Asymptomatic bacteriuria   AKI (acute kidney injury) (HCC)  [name] is a 87 year old woman with  past medical history significant for Alzheimer disease and recurrent falls who presented [facility] on 08/09/2020 following a fall found to have a displaced right femoral neck fracture now status post hemiarthroplasty with hospital course complicated by acute blood loss anemia.   #Displaced right femoral neck fracture status post hemiarthroplasty, active #Osteoporosis, chronic Patient has recovered well from her recent operation and stable for discharge to [facility] for continued rehabilitation with physical and occupational therapy. Patient would benefit from continued anticoagulation and pain control with acetaminophen. -Continue PT and OT at SNF ([facility]) -Continue weight bearing as tolerated with walker -Continue acetaminophen PRN -Rivaroxaban 10mg  daily for total of 35 days   #Acute blood loss anemia, active Patient's hemoglobin improved to 7.9 from 7.0 following transfusion yesterday. No signs or symptoms to suggest ongoing bleed. Patient would benefit from continued iron supplementation following discharge. -Continue iron polysaccharide 150mg  daily   #Hypertension, chronic Patient's blood pressure mildly elevated though in setting of pain following operation. Will not increase patient's antihypertensive regimen at this time. -Continue home carvedilol 6.25mg  twice daily   #Vitamin D deficiency, chronic 25 OH vitamin D noted to be 12.26. Patient received first dose of 50,000 IU Vitamin D3 on 03/05 -Continue supplementation for  6-8 weeks total  #Bowel regimen: Senna 8.6mg  twice daily, Miralax 17g daily PRN #VTE ppx: Rivaroxaban 10mg  daily #Code status: DNR #Diet: Dysphagia 1  Prior to Admission Living Arrangement: [facility] Anticipated Discharge Location: [facility] Barriers to Discharge: None Dispo: Anticipated discharge in approximately 0-1 day(s).   , MD 08/15/2020, 11:54 AM Pager: [phone] After 5pm on weekdays and 1pm on weekends: On Call pager  [phone]

## 2020-08-15 NOTE — TOC Transition Note (Addendum)
Transition of Care [facility]) - CM/SW Discharge Note   Patient Details  Name: [name] MRN: [id number] Date of Birth: September 19, 1915  Transition of Care [facility]) CM/SW Contact:  Levada Schilling Phone Number: 08/15/2020, 12:27 PM   Clinical Narrative:    Patient will Discharge To: [facility] Anticipated DC Date:08/15/20 Family Notified:yes, daughter, Annett Fabian, [phone] Transport IW:LNLG   Per MD patient ready for DC to . RN, patient, patient's family, and facility notified of DC. Assessment, Fl2/Pasrr, and Discharge Summary sent to facility. RN given number for report [phone], Room # 208 Door). DC packet on chart. Ambulance transport requested for patient.   Please contact Marylene Land @ [phone], SW from [facility] when pt has been transported to [facility].  CSW signing off.  Budd Palmer [facility] [phone]     Final next level of care: Skilled Nursing Facility Barriers to Discharge: No Barriers Identified   Patient Goals and CMS Choice        Discharge Placement              Patient chooses bed at: [facility] Patient to be transferred to facility by: [facility] Name of family member notified: Annett Fabian, daughter Patient and family notified of of transfer: 08/15/20  Discharge Plan and Services                                     Social Determinants of Health (SDOH) Interventions     Readmission Risk Interventions No flowsheet data found.

## 2020-08-15 NOTE — Progress Notes (Signed)
Physical Therapy Treatment Patient Details Name: [name] MRN: [id number] DOB: [birth year] Today's Date: 08/15/2020    History of Present Illness Pt is a 87 y.o. female who presented to the ED 08/09/20 after an unwitnessed fall resulting in a R femoral neck fx. S/p anterior approach R hip hemiarthroplasty 08/11/20. PMH: Alzheimer's dementia, CKD stage IIIb, HTN, L foot ulcer, and hyperlipidemia.    PT Comments    Pt with baseline dementia and following minimal commands.  Fear and anxiety cause resistance with mobility but able to move into partial stand in sara stedy.  Pt continues to benefit from snf placement.  Based on diagnosis and d/c disposition will reduce frequency to 3x/week per policy.  Will inform supervising PT.     Follow Up Recommendations  SNF;Supervision/Assistance - 24 hour     Equipment Recommendations  None recommended by PT    Recommendations for Other Services       Precautions / Restrictions Precautions Precautions: Fall Precaution Booklet Issued: No Precaution Comments: HOH Restrictions Weight Bearing Restrictions: Yes RLE Weight Bearing: Weight bearing as tolerated    Mobility  Bed Mobility Overal bed mobility: Needs Assistance Bed Mobility: Supine to Sit       Sit to supine: Total assist;+2 for physical assistance   General bed mobility comments: Pt not assisting actively with any movements due to impaired cognition and pain, TAx2 for all.    Transfers Overall transfer level: Needs assistance Equipment used: Ambulation equipment used (sara stedy) Transfers: Sit to/from Stand Sit to Stand: Total assist;+2 physical assistance (+3 to place sara plates and assist in hip extension.)         General transfer comment: Cues for sequencing and hand placement to rise into standing.  Pt pushing posterior fighting facilitation into hip extension.  Ambulation/Gait                 Stairs             Wheelchair Mobility    Modified  Rankin (Stroke Patients Only)       Balance Overall balance assessment: Needs assistance Sitting-balance support: Bilateral upper extremity supported Sitting balance-Leahy Scale: Poor       Standing balance-Leahy Scale: Poor                              Cognition Arousal/Alertness: Lethargic Behavior During Therapy: Restless;Anxious Overall Cognitive Status: History of cognitive impairments - at baseline                                        Exercises      General Comments        Pertinent Vitals/Pain Pain Assessment: Faces Faces Pain Scale: Hurts little more Pain Location: R hip with mobility Pain Descriptors / Indicators: Discomfort;Grimacing;Guarding;Moaning Pain Intervention(s): Monitored during session;Repositioned    Home Living                      Prior Function            PT Goals (current goals can now be found in the care plan section) Acute Rehab PT Goals Patient Stated Goal: did not state Potential to Achieve Goals: Fair Progress towards PT goals: Progressing toward goals    Frequency    Min 3X/week      PT Plan Frequency needs to  be updated    Co-evaluation              AM-PAC PT "6 Clicks" Mobility   Outcome Measure  Help needed turning from your back to your side while in a flat bed without using bedrails?: Total Help needed moving from lying on your back to sitting on the side of a flat bed without using bedrails?: Total Help needed moving to and from a bed to a chair (including a wheelchair)?: Total Help needed standing up from a chair using your arms (e.g., wheelchair or bedside chair)?: Total Help needed to walk in hospital room?: Total Help needed climbing 3-5 steps with a railing? : Total 6 Click Score: 6    End of Session Equipment Utilized During Treatment: Gait belt Activity Tolerance: Patient limited by fatigue;Patient limited by pain Patient left: in bed;with call bell/phone  within reach;with bed alarm set Nurse Communication: Mobility status PT Visit Diagnosis: Unsteadiness on feet (R26.81);Muscle weakness (generalized) (M62.81);History of falling (Z91.81);Difficulty in walking, not elsewhere classified (R26.2);Pain Pain - Right/Left: Right Pain - part of body: Hip     Time: 7989-2119 PT Time Calculation (min) (ACUTE ONLY): 33 min  Charges:  $Therapeutic Activity: 23-37 mins                     [name] , PTA Acute Rehabilitation Services Pager [phone] Office [phone]     [name] 08/15/2020, 2:57 PM

## 2020-11-08 DEATH — deceased

## 2023-01-21 IMAGING — RF DG C-ARM 1-60 MIN
1 series · 5 of 5 positions shown · non-contrast
Comparison: August 09, 2020.

CLINICAL DATA: Anterior right hip.

EXAM:
DG C-ARM 1-60 MIN; OPERATIVE RIGHT HIP WITH PELVIS
FLUOROSCOPY TIME:  Fluoroscopy Time:  10 seconds
Radiation Exposure Index (if provided by the fluoroscopic device): 3
mGy
Number of Acquired Spot Images: 5

[Series 1: unknown protocol · 0.20mm/px · 5 of 5 slices shown]
[im 1/5]
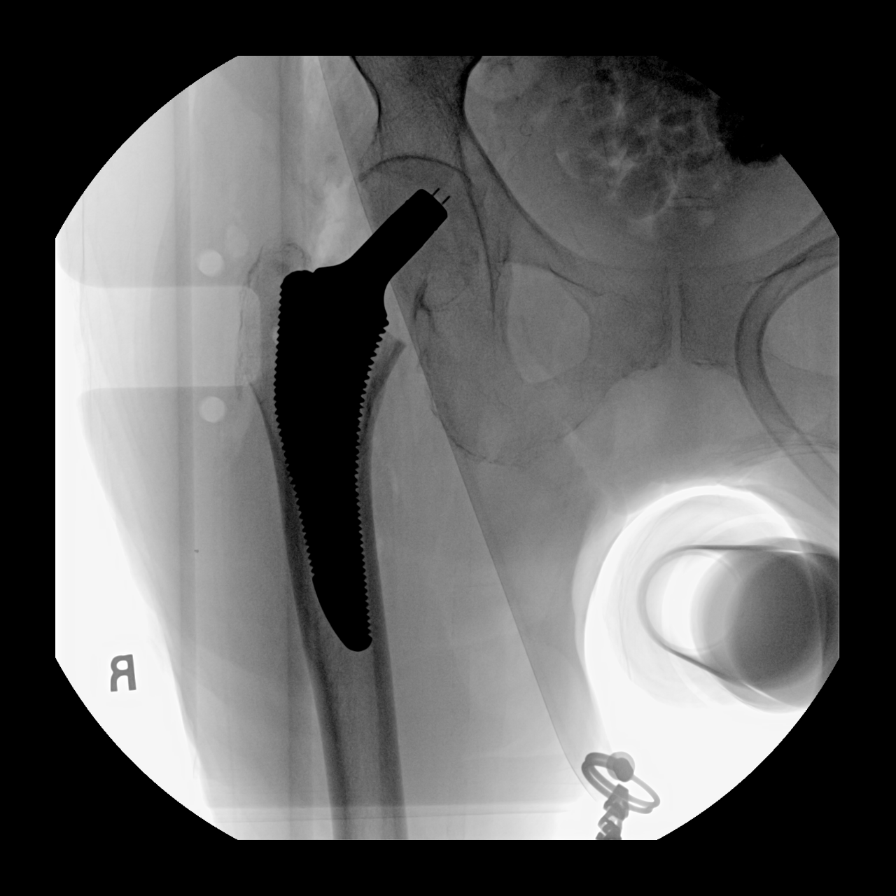
[im 2/5]
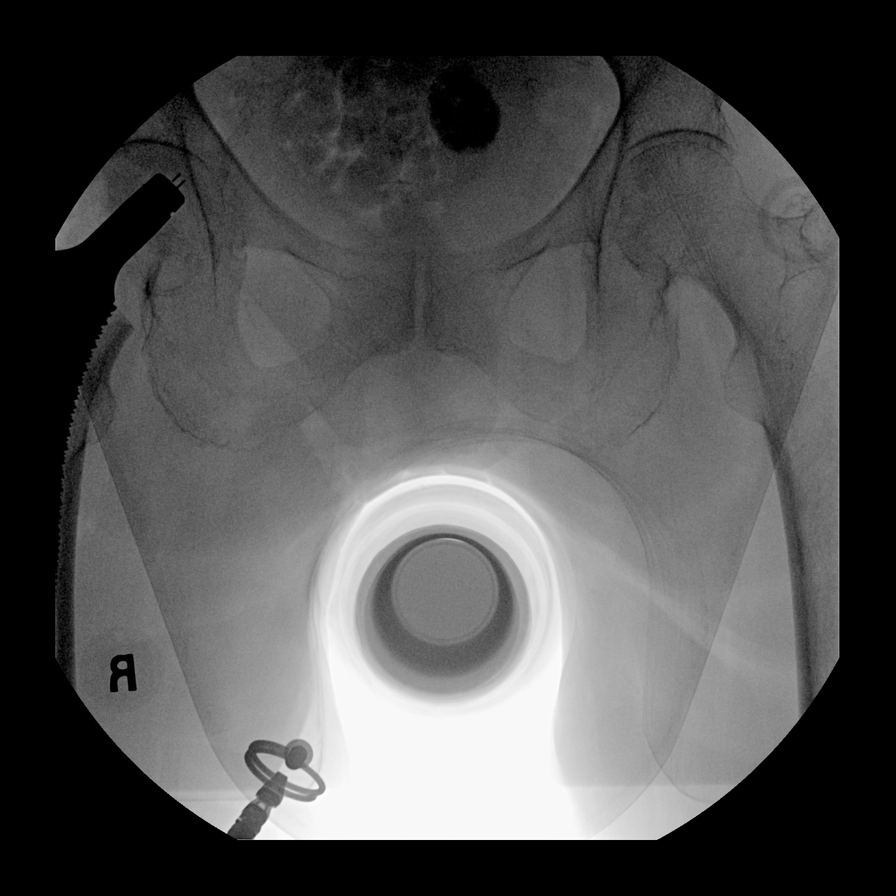
[im 3/5]
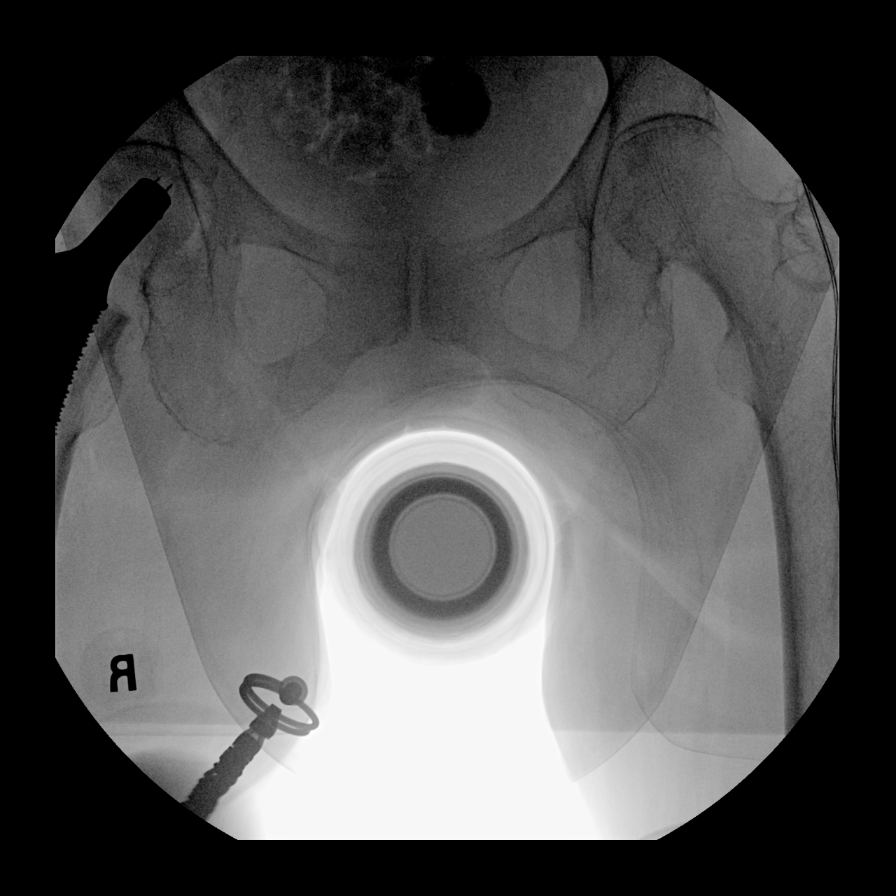
[im 4/5]
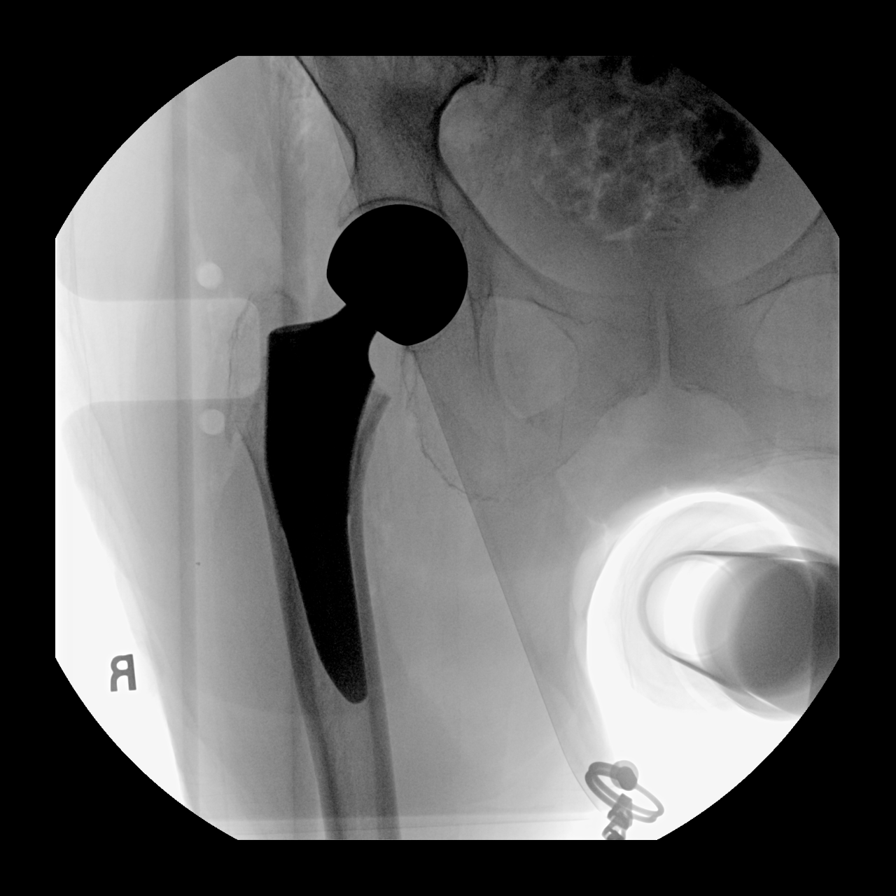
[im 5/5]
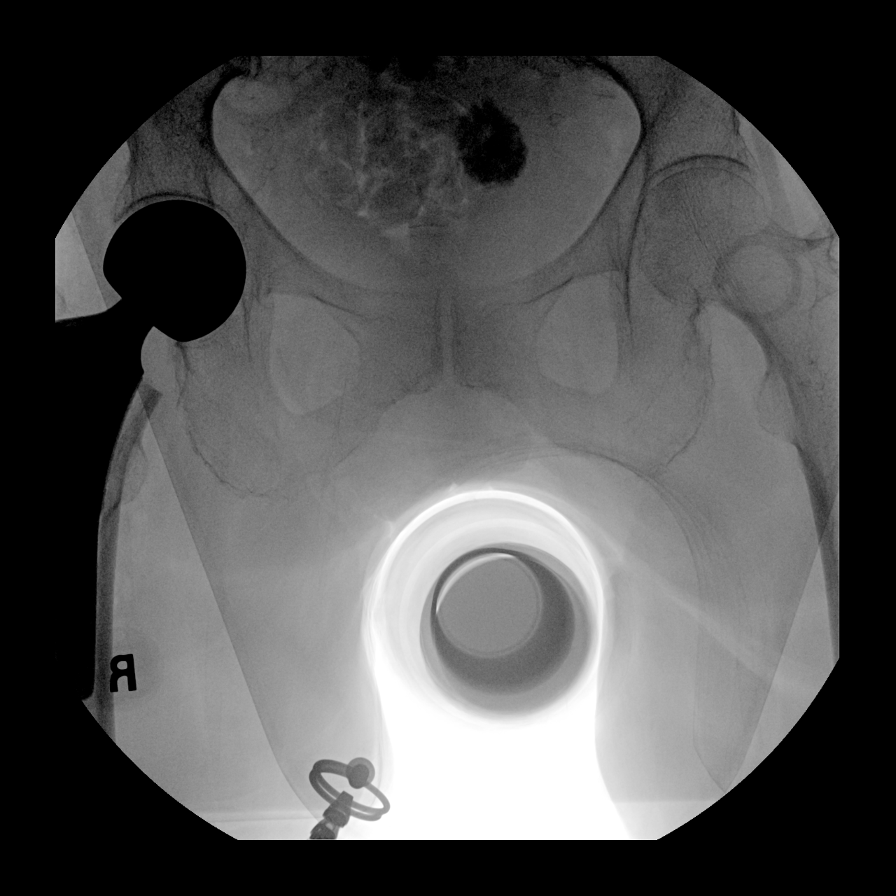

[5 of 5 positions shown; findings below may reference images not displayed]

FINDINGS: Five C-arm fluoroscopic images were obtained intraoperatively and
submitted for post operative interpretation. These images
demonstrate surgical changes associated with right total hip
arthroplasty. No unexpected findings. Please see the performing
provider's procedural report for further detail.
IMPRESSION: Right total hip arthroplasty.
